# Patient Record
Sex: Female | Born: 1985 | Race: White | Hispanic: No | Marital: Married | Smoking: Former smoker
Health system: Southern US, Community
[De-identification: ages and names within clinical notes are randomized; demographics above are authoritative.]

---
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
## 2005-01-10 ENCOUNTER — Ambulatory Visit: Payer: Self-pay | Admitting: Family Medicine

## 2005-01-24 ENCOUNTER — Ambulatory Visit: Payer: Self-pay | Admitting: Family Medicine

## 2005-02-05 ENCOUNTER — Inpatient Hospital Stay (HOSPITAL_COMMUNITY): Admission: AD | Admit: 2005-02-05 | Discharge: 2005-02-05 | Payer: Self-pay | Admitting: *Deleted

## 2005-02-07 ENCOUNTER — Ambulatory Visit: Payer: Self-pay | Admitting: *Deleted

## 2005-02-14 ENCOUNTER — Ambulatory Visit: Payer: Self-pay | Admitting: Family Medicine

## 2005-02-28 ENCOUNTER — Ambulatory Visit: Payer: Self-pay | Admitting: Family Medicine

## 2005-03-07 ENCOUNTER — Ambulatory Visit: Payer: Self-pay | Admitting: Family Medicine

## 2005-03-14 ENCOUNTER — Ambulatory Visit: Payer: Self-pay | Admitting: *Deleted

## 2005-03-14 ENCOUNTER — Inpatient Hospital Stay (HOSPITAL_COMMUNITY): Admission: AD | Admit: 2005-03-14 | Discharge: 2005-03-17 | Payer: Self-pay | Admitting: Obstetrics & Gynecology

## 2006-05-17 IMAGING — US US OB COMP +14 WK
1 series · 13 of 28 positions shown · non-contrast
Comparison: none

CLINICAL DATA: Bleeding, cramping, assigned gestational age of 22 weeks, 6 days.

[Series 1: us ob comp +14 wk · 0.33mm/px · 13 of 118 slices shown]
[im 5/118]
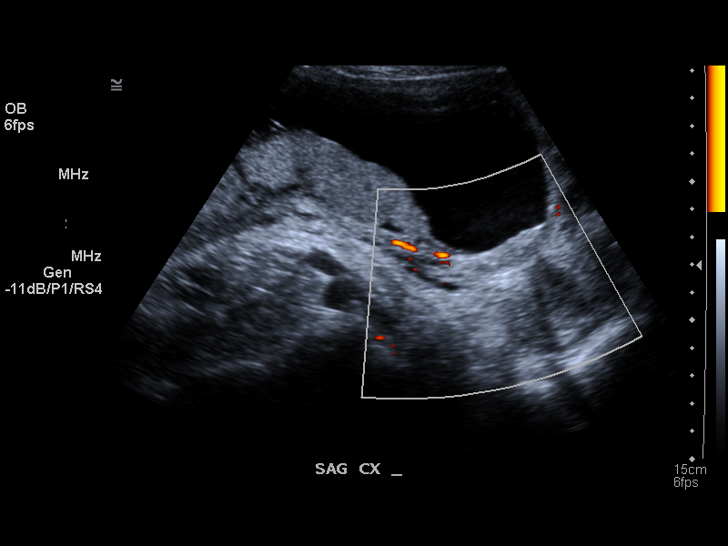
[im 14/118]
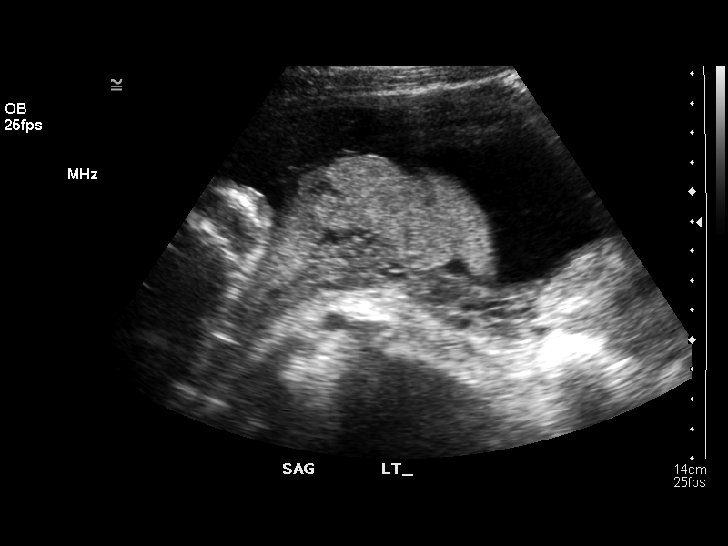
[im 22/118]
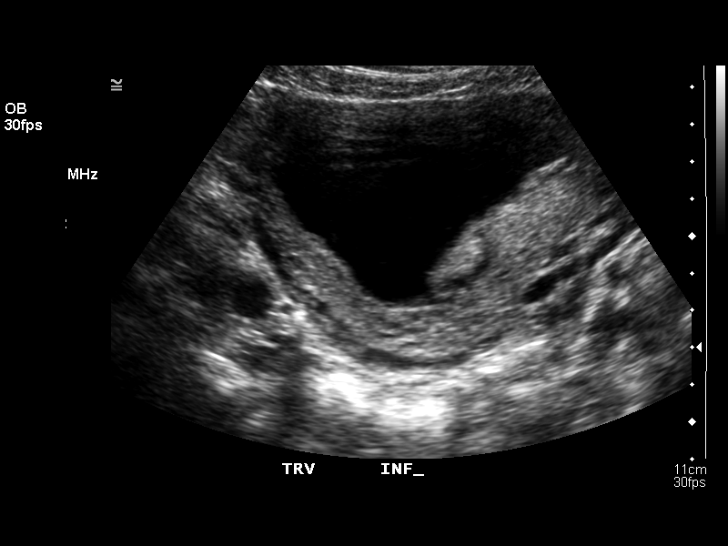
[im 31/118]
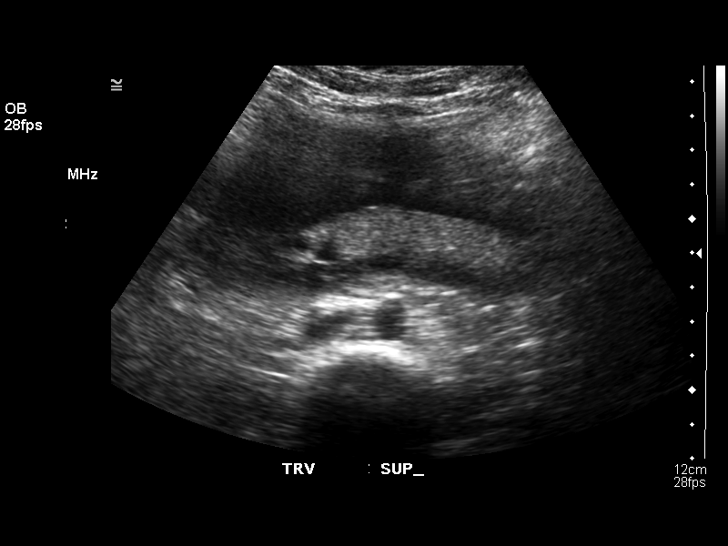
[im 40/118]
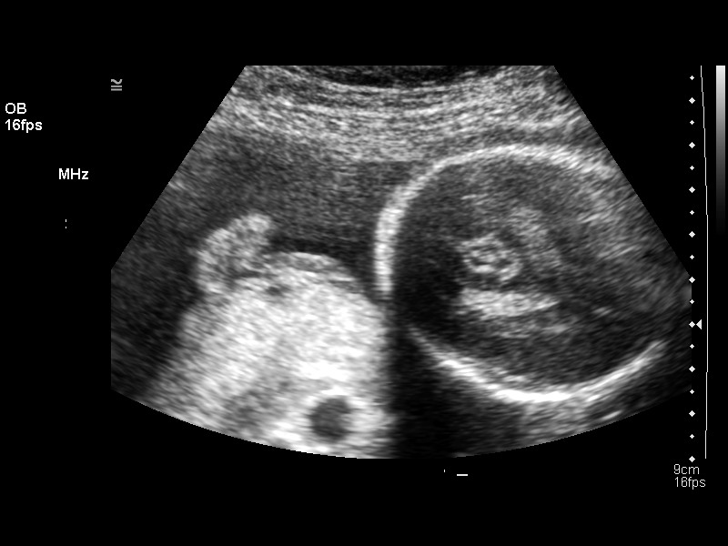
[im 48/118]
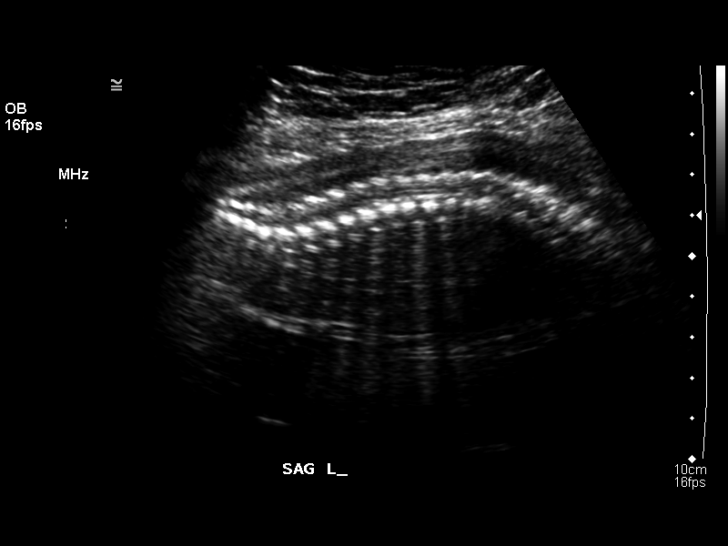
[im 61/118]
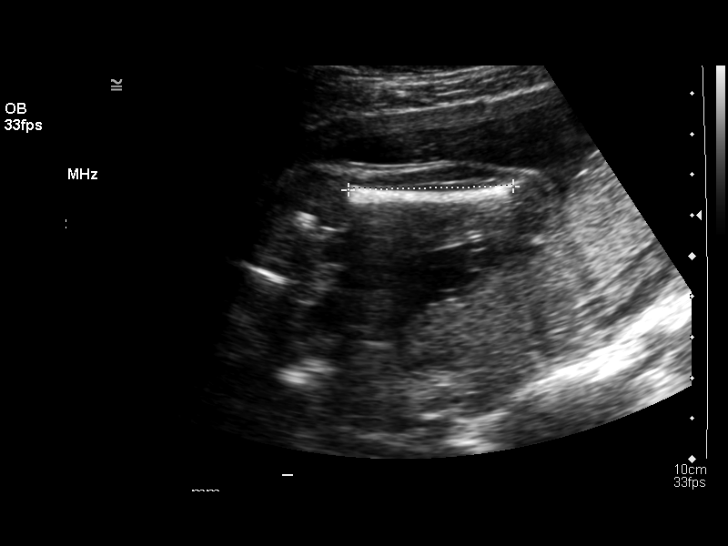
[im 70/118]
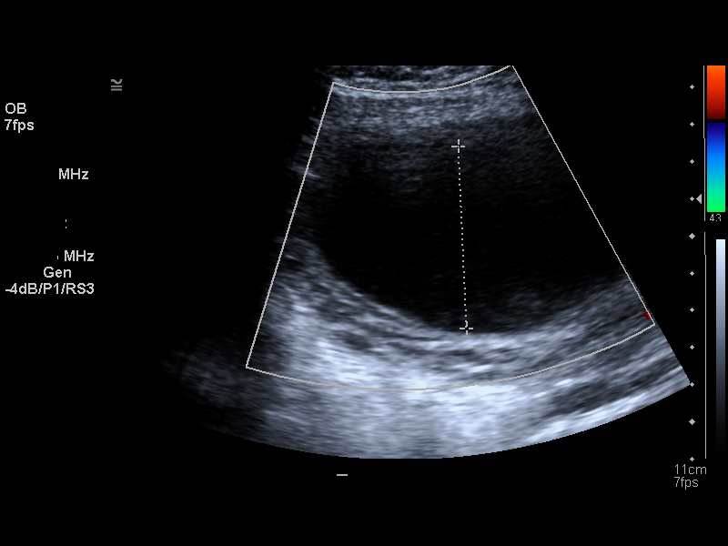
[im 79/118]
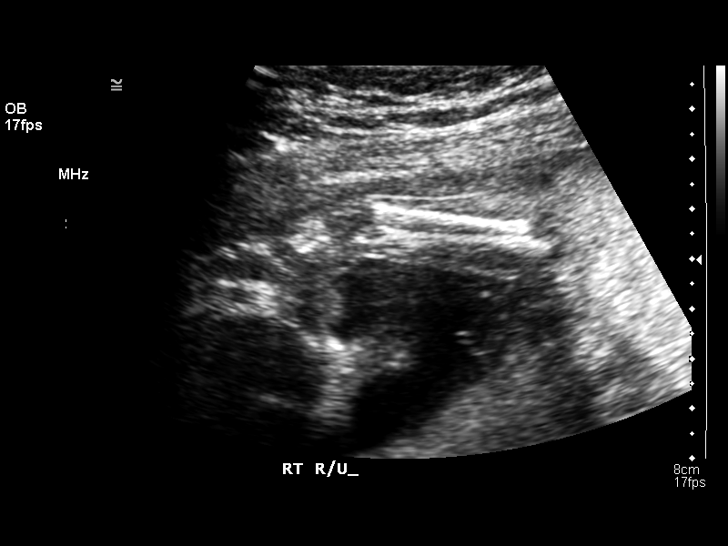
[im 87/118]
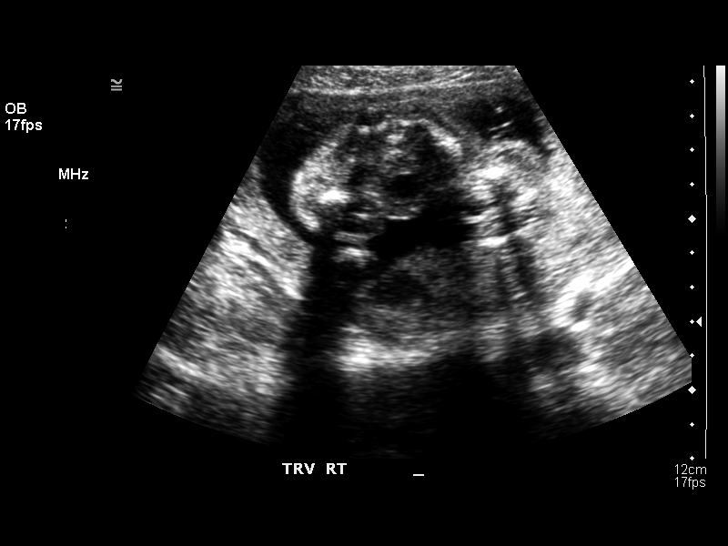
[im 96/118]
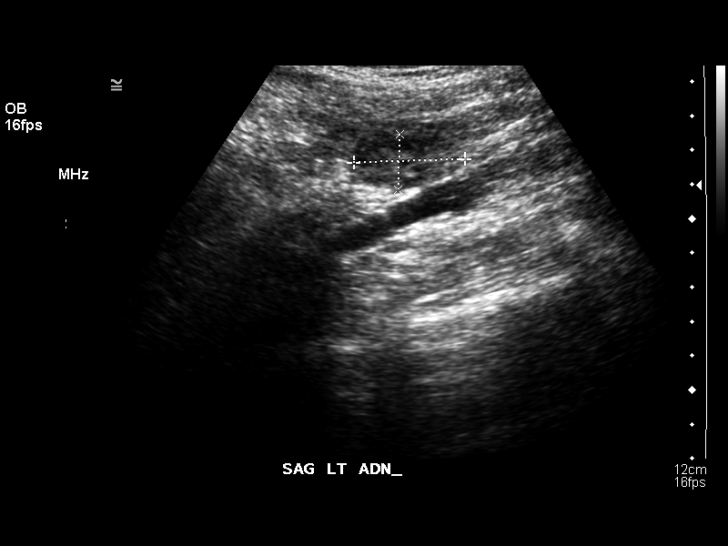
[im 105/118]
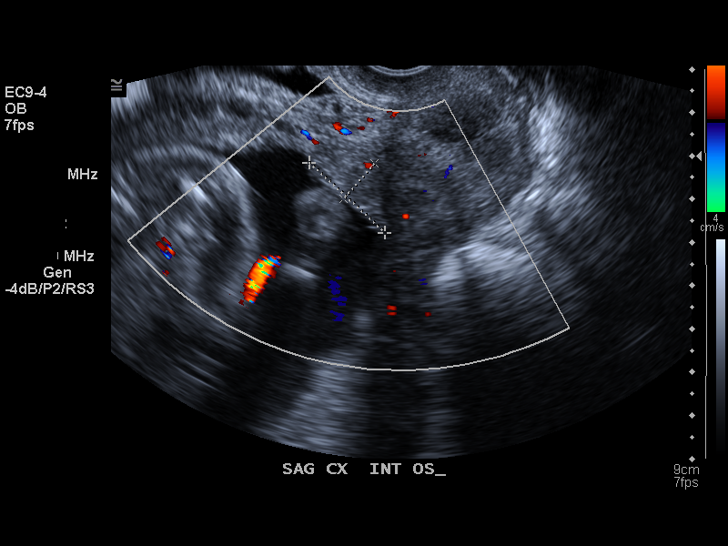
[im 113/118]
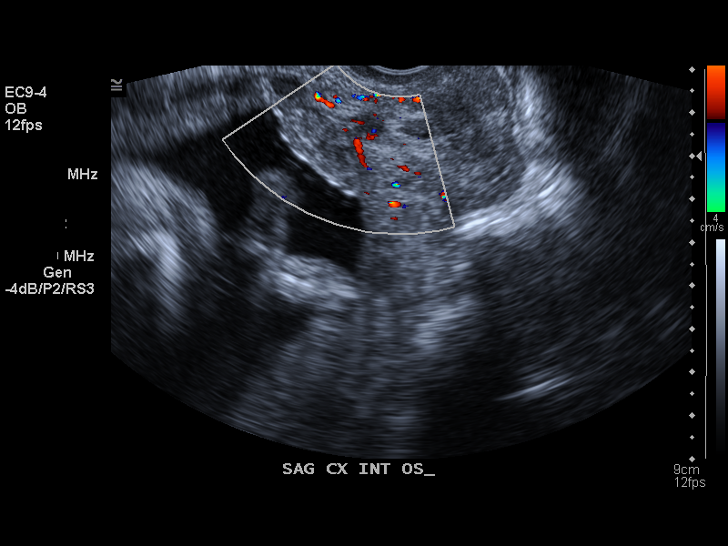

[13 of 28 positions shown; findings below may reference images not displayed]

OBSTETRICAL ULTRASOUND:
 Number of Fetuses:  Single
 Heart Rate:  147 bpm
 Movement:  Yes
 Breathing:  No
 Presentation:  Transverse
 Placental Location:  Posterior
 Grade:  0
 Previa:  No
 Amniotic Fluid (Subjective):  Normal
 Amniotic Fluid (Objective):   Vertical Pocket 4.9 cm

 FETAL BIOMETRY
 BPD:  5.4 cm  22 w  3 d
 HC:  20.5 cm  22 w  4 d
 AC:  19.6 cm  24 w  2 d
 FL:  4.0 cm  22 w   5 d

 MEAN GA:  23 w  0 d    
 EFW:  

 FETAL ANATOMY
 Lateral Ventricles:  Visualized 
 Thalami/CSP:  Visualized   
 Posterior Fossa:  Visualized   
 Nuchal Region:  Not Visualized 
 Spine:  Visualized   
 4 Chamber Heart on Left:  Visualized   
 Stomach on Left:  Visualized   
 3 Vessel Cord:  Visualized 
 Cord Insertion site:    Visualized 
 Kidneys:  Visualized 
 Bladder:  Visualized   
 Extremities:  Visualized   

 ADDITIONAL ANATOMY VISUALIZED:  Upper lip, Orbits, Diaphragm, Heel, Aortic Arch

 Evaluation limited by:

 MATERNAL UTERINE AND ADNEXAL FINDINGS
 Cervix: 4.0 cm
IMPRESSION: Single living intrauterine gestation currently in transverse lie with the head to the maternal left.   There is a questionable marginal abruption associated with the right lateral inferior placenta measuring 3.4 x .7 x 2.3 cm in size and there is a small echogenic area visualized at the internal os of the cervix measuring 2.4 x 1.1 x 2.8 cm in size which appears separate from the placenta.   This is of uncertain etiology.  The amniotic fluid volume is normal.   

 </u12:p>

## 2006-06-08 IMAGING — US US OB FOLLOW-UP
1 series · 18 of 28 positions shown · non-contrast
Comparison: none

CLINICAL DATA: 26 weeks pregnant, placenta previa, bright red blood

[Series 1: us ob follow-up · 18 of 37 slices shown]
[im 1/37]
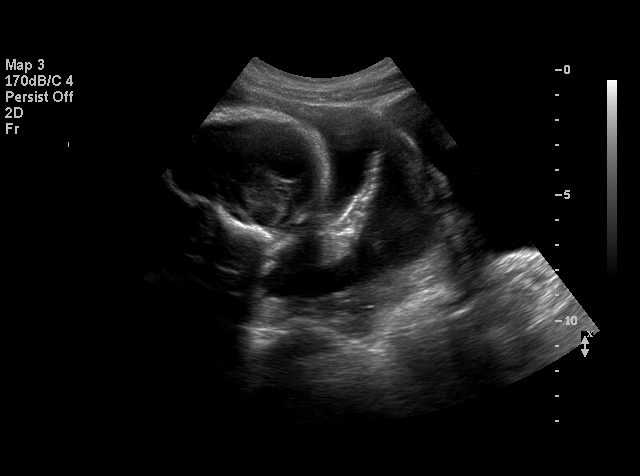
[im 3/37]
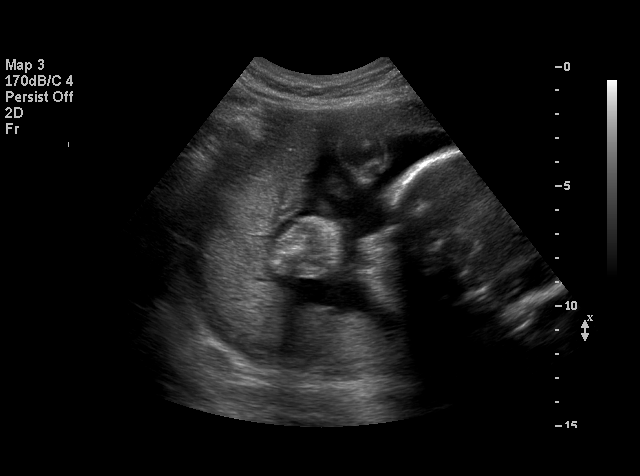
[im 5/37]
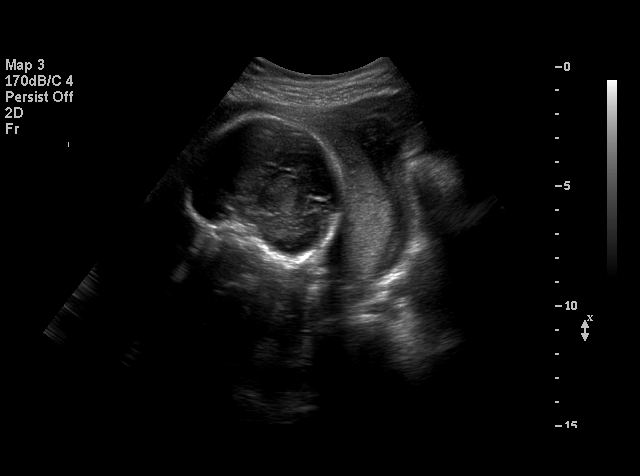
[im 7/37]
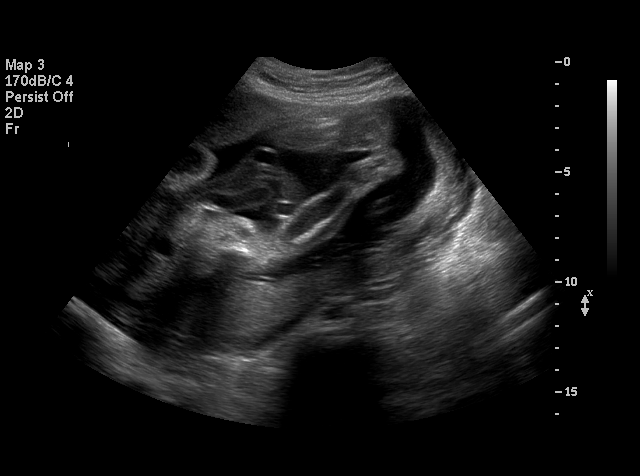
[im 10/37]
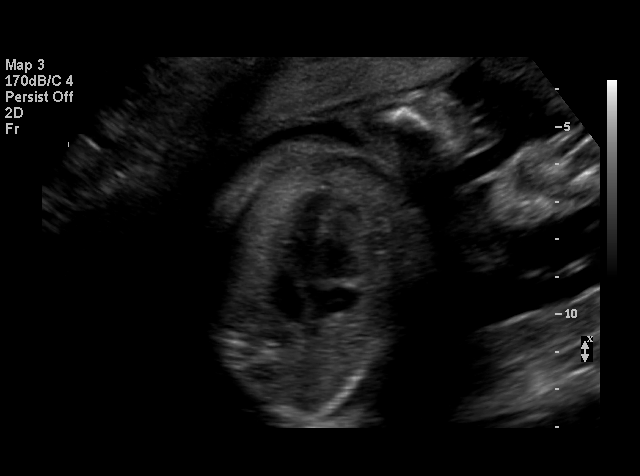
[im 11/37]
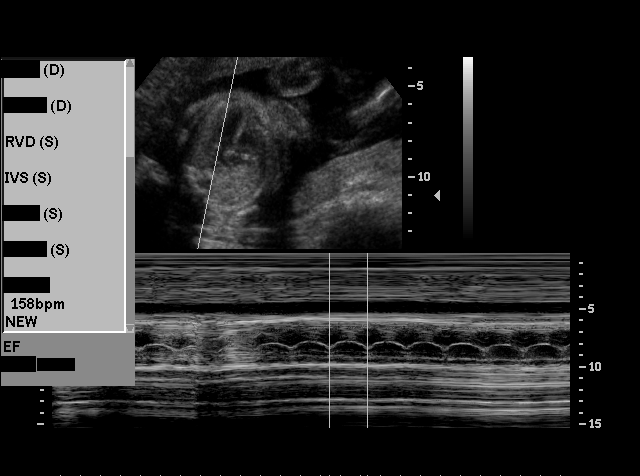
[im 14/37]
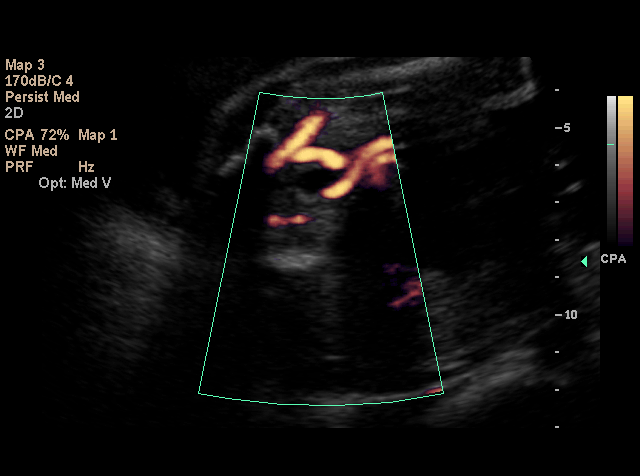
[im 15/37]
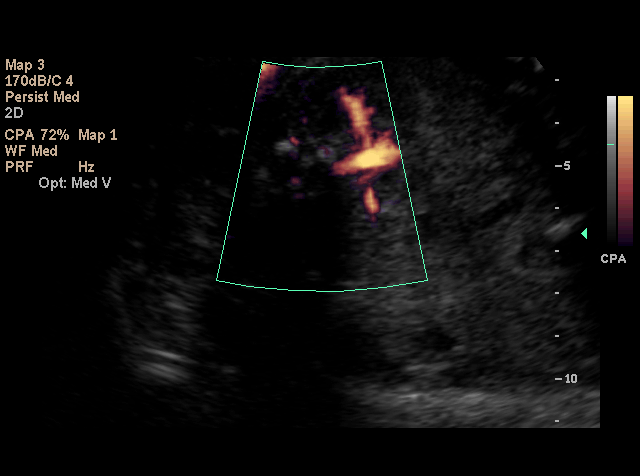
[im 18/37]
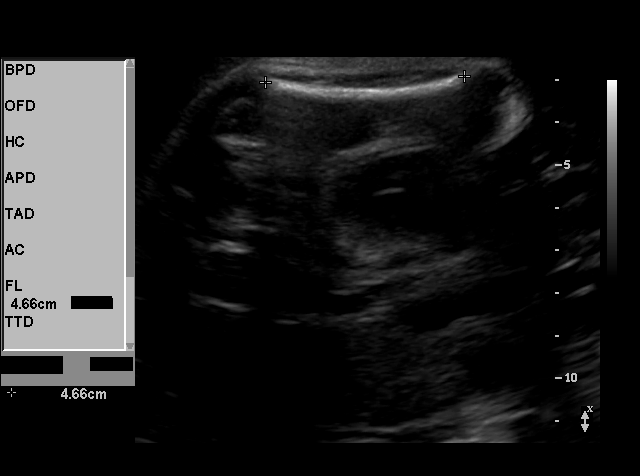
[im 19/37]
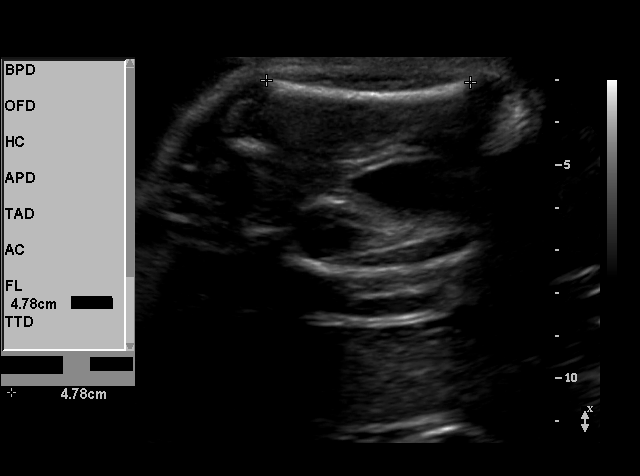
[im 22/37]
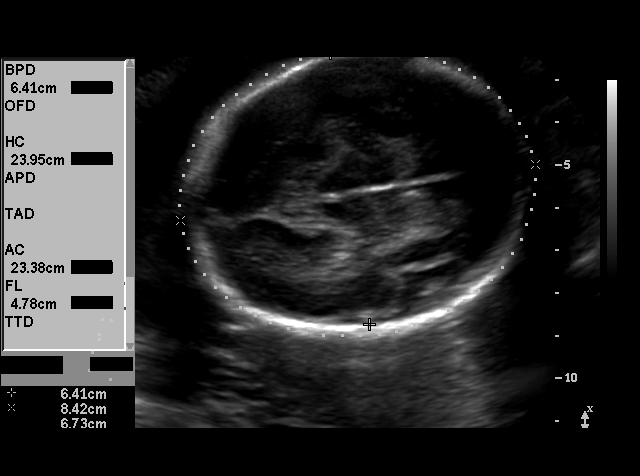
[im 23/37]
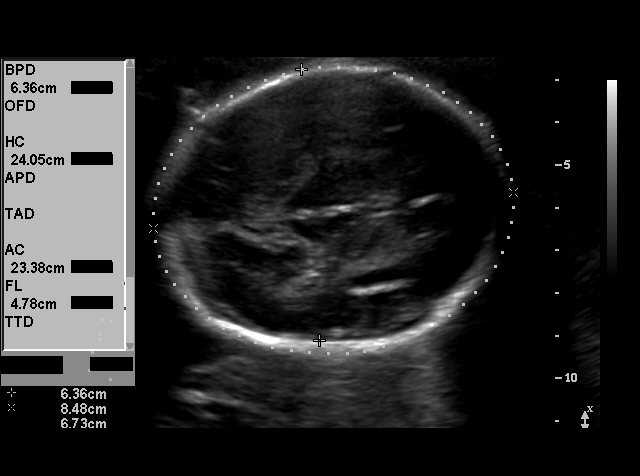
[im 26/37]
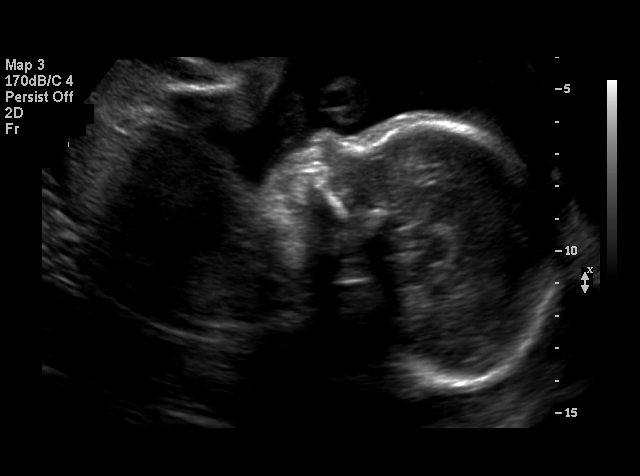
[im 29/37]
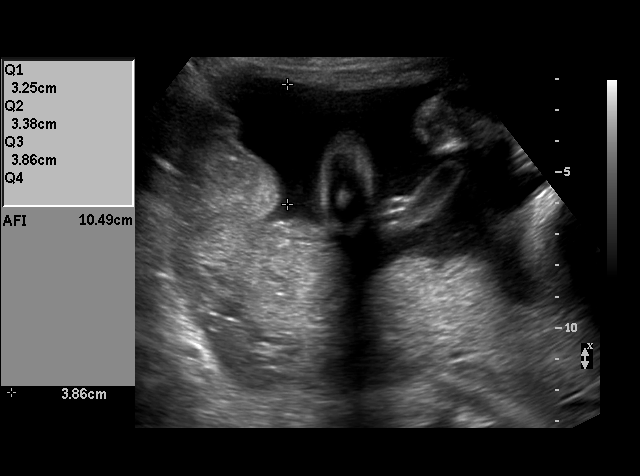
[im 30/37]
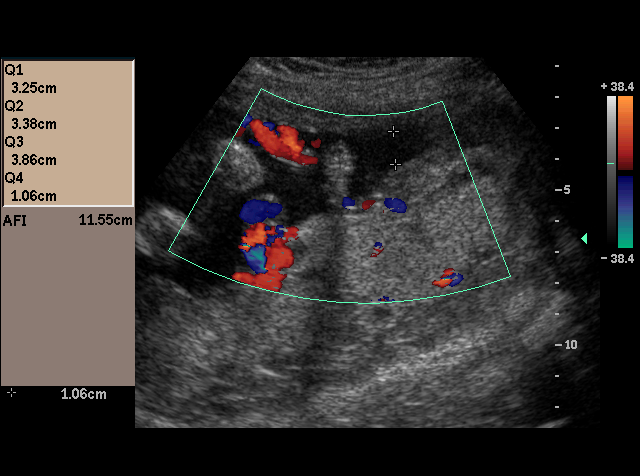
[im 33/37]
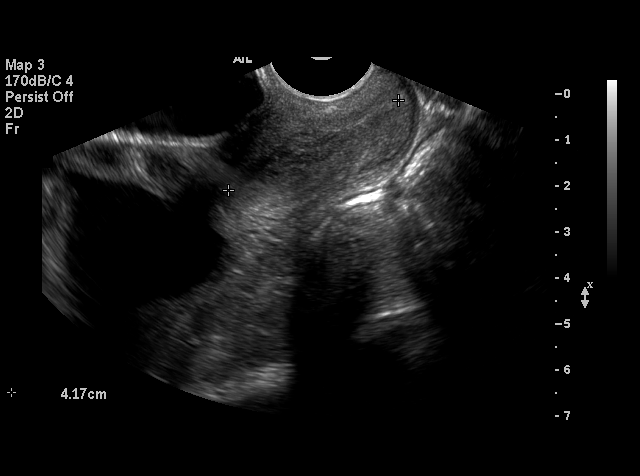
[im 34/37]
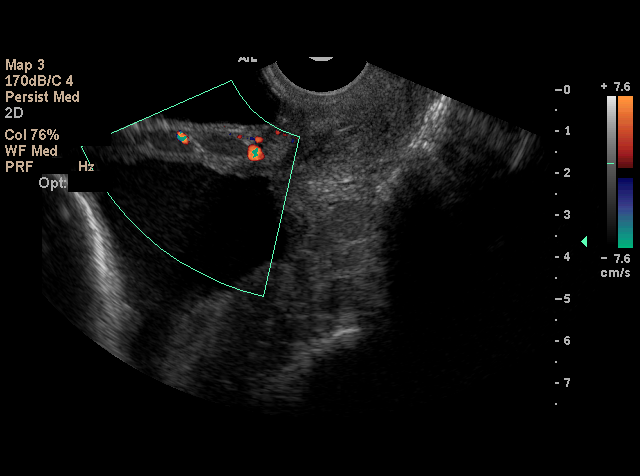
[im 37/37]
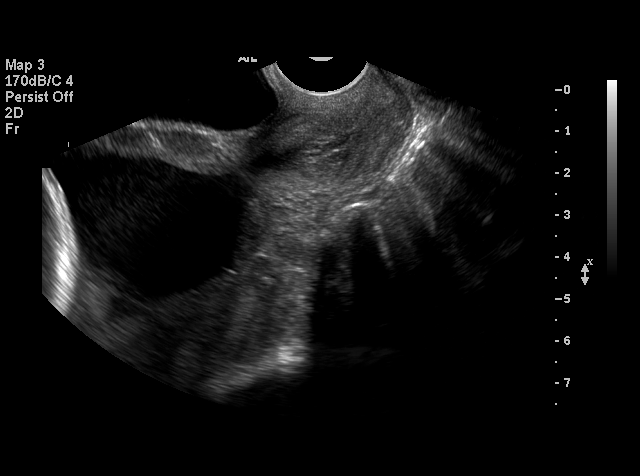

[18 of 28 positions shown; findings below may reference images not displayed]

OBSTETRICAL ULTRASOUND RE-EVALUATION WITH TRANSVAGINAL:
 Number of Fetuses: 1
 Heart Rate: 158 bpm
 Movement: yes
 Breathing:  no
 Presentation:  cephalic
 Placental Location:  posterior, left lateral
 Grade: 1
 Previa: no
 Amniotic Fluid (subjective): normal
 Amniotic Fluid (objective): AFI 11.6 cm (7th-27th %ile = 9.7 to 22.3 cm for 26 weeks) 

 FETAL BIOMETRY
 BPD: 6.4 cm – 25 w 6 d
 HC: 24.0 cm – 26 w 1 d
 AC: 22.8 cm – 27 w 2 d
 FL: 4.7 cm – 25 w 6 d 

 Mean GA:  26 w 2 d
 Assigned GA: 26 w 0 d

 EFW: 944 grams (H) 97th-34th %ile (885 to 9497 g) for 26 weeks 

 FETAL ANATOMY
 Lateral Ventricles:  visualized 
 Thalami/CSP: previously seen 
 Posterior Fossa: previously seen 
 Nuchal Region: previously seen 
 Spine: previously seen 
 4 Chamber Heart on Left: previously seen 
 Stomach on Left: visualized 
 3 Vessel Cord: visualized 
 Cord Insertion Site: visualized 
 Kidneys:  visualized 
 Bladder: visualized 
 Extremities: previously seen 

 MATERNAL UTERINE AND ADNEXAL FINDINGS
 Cervix:  4.2 cm transvaginally
IMPRESSION: 1.  Single live intrauterine pregnancy in cephalic presentation.  Placenta is posterior and right lateral with the inferior extent poorly visualized due to the fetal calvarium.  However, the inferior edge is seen, low lying but approximately 2.5 cm from the cervical os.  Cervix appears closed, 4.2 cm length.  The small abruption questioned on the previous exam is not seen on the current study, though visualization of the inferior placenta is somewhat limited.
 2.  Appropriate growth since prior ultrasound.
 3.  Normal amniotic fluid volume with AFI 11.6 cm, normal.

## 2006-06-19 IMAGING — US US OB FOLLOW-UP
1 series · 13 of 28 positions shown · non-contrast
Comparison: none

CLINICAL DATA: Assess growth.

[Series 1: us ob follow-up · 13 of 35 slices shown]
[im 2/35]
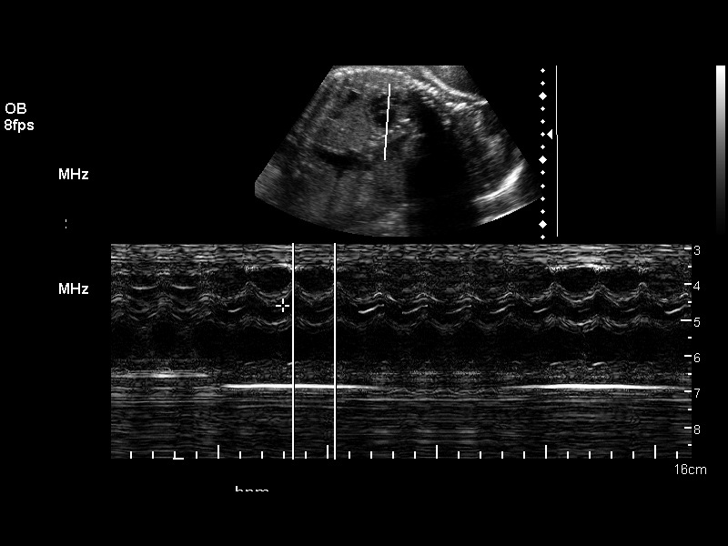
[im 4/35]
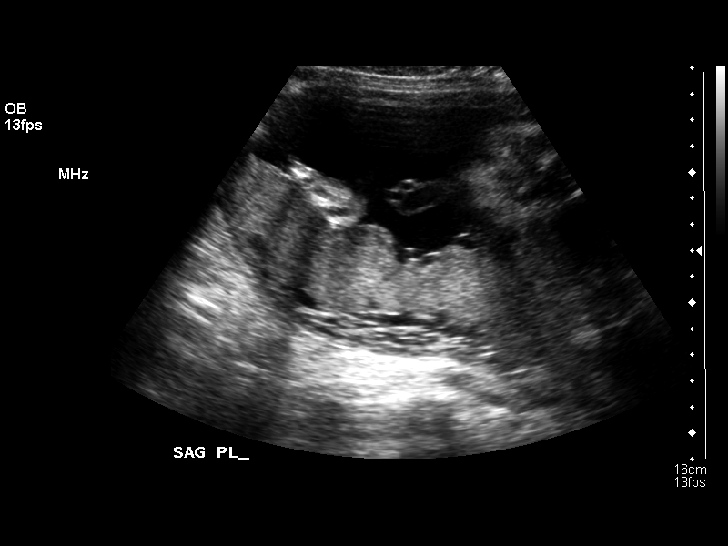
[im 7/35]
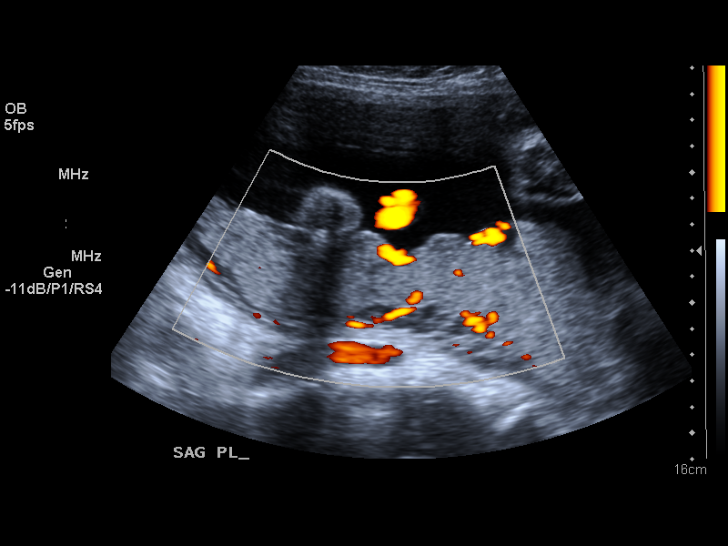
[im 9/35]
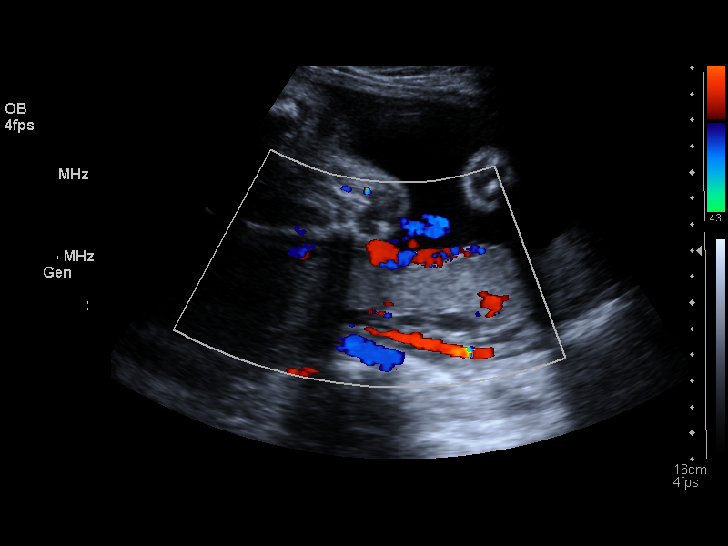
[im 12/35]
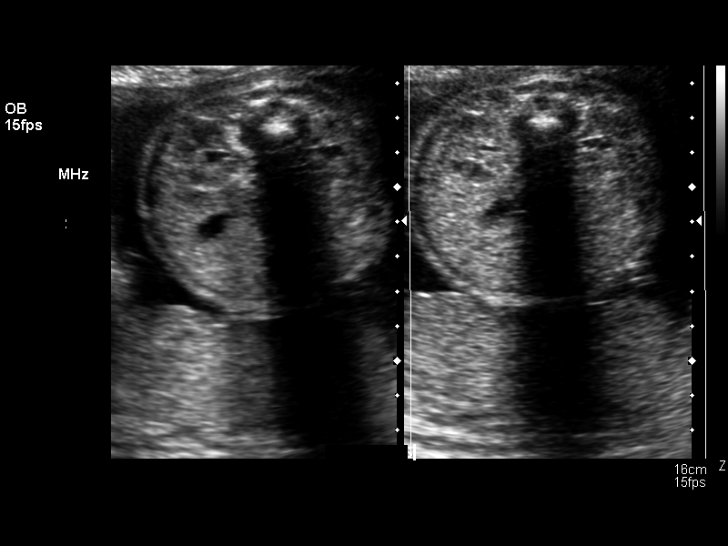
[im 14/35]
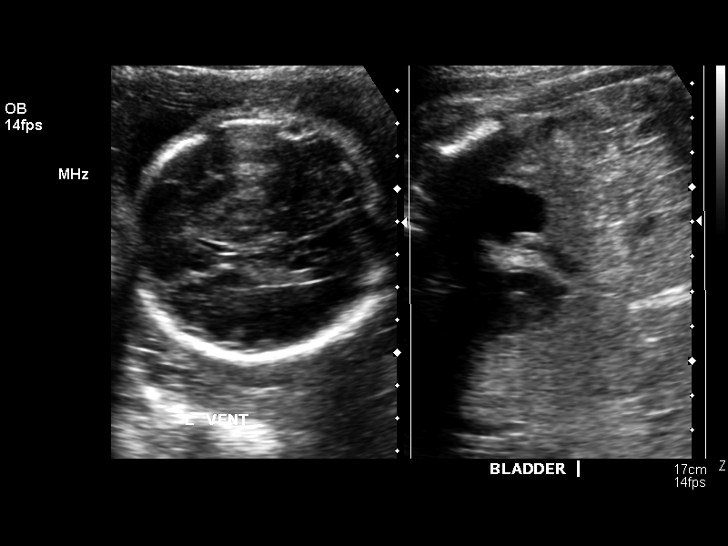
[im 18/35]
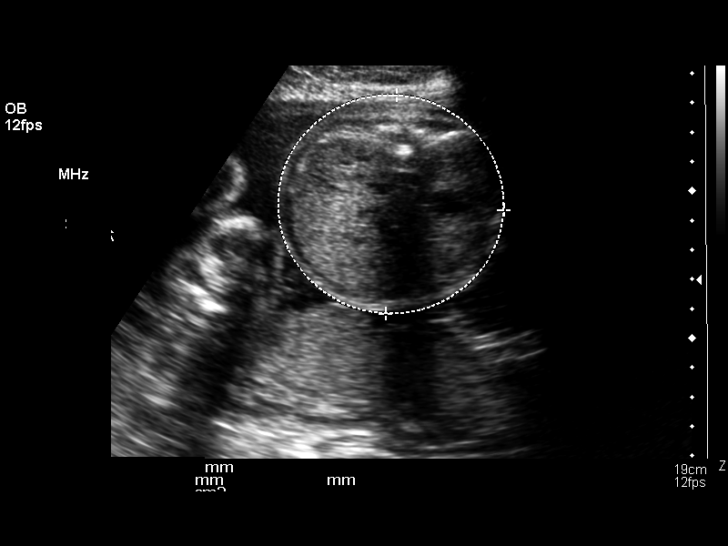
[im 21/35]
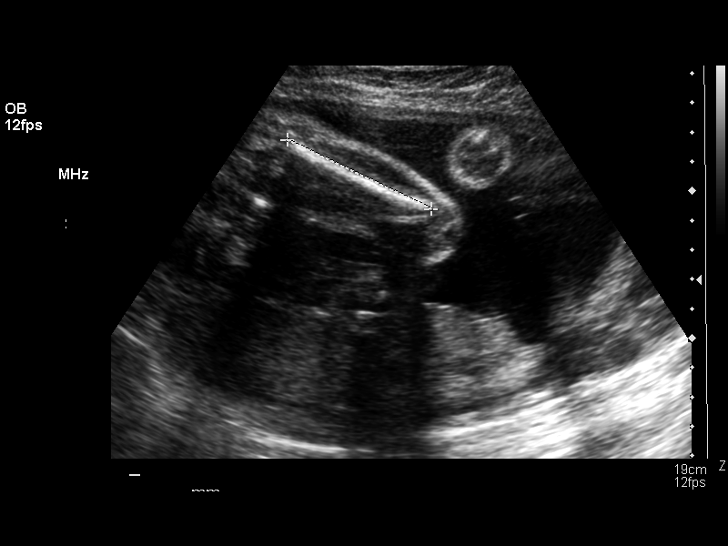
[im 23/35]
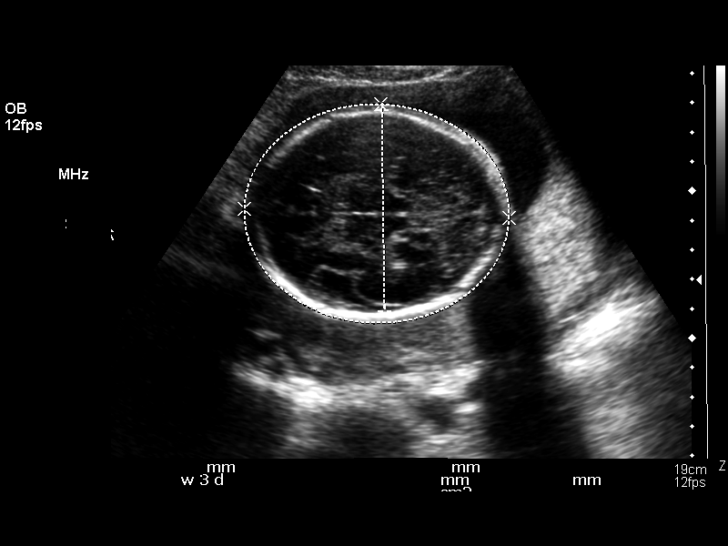
[im 26/35]
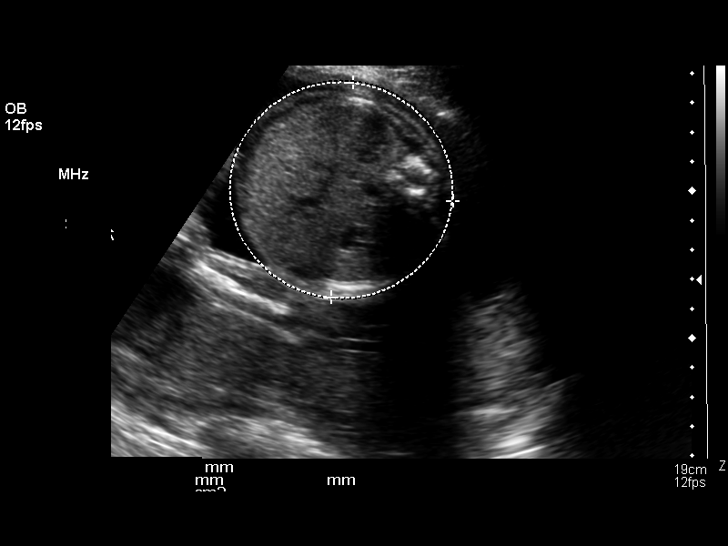
[im 28/35]
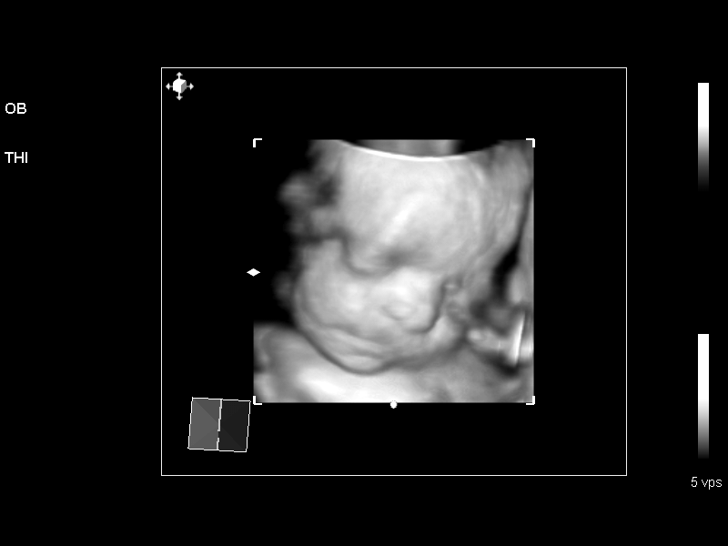
[im 31/35]
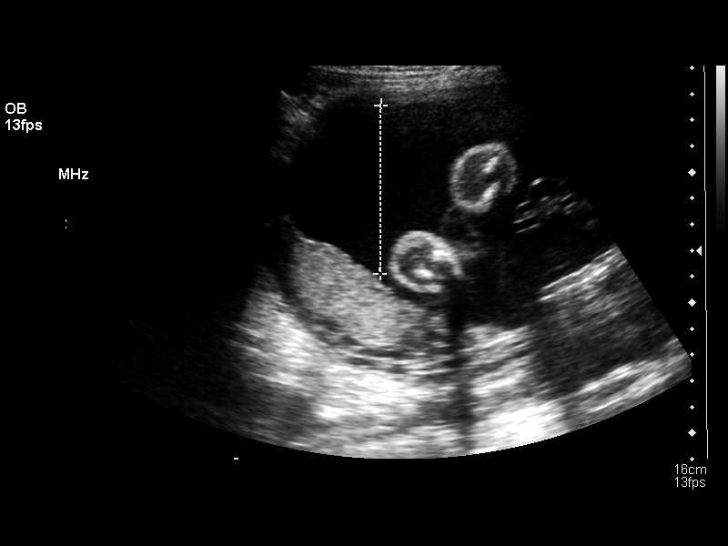
[im 33/35]
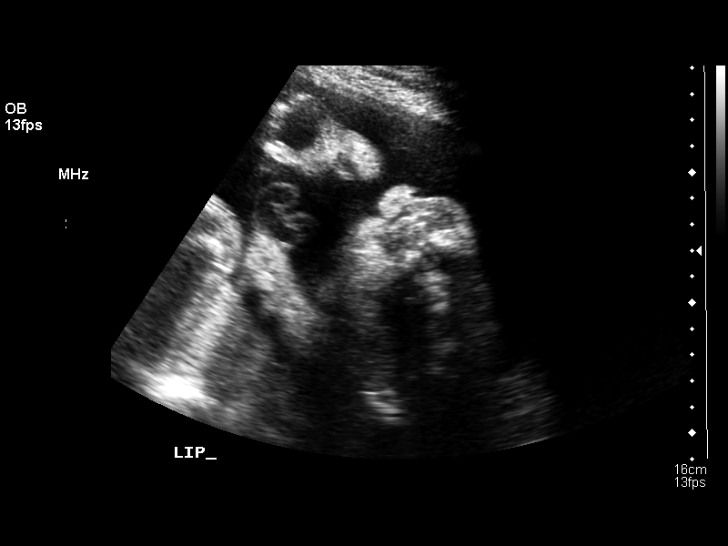

[13 of 28 positions shown; findings below may reference images not displayed]

OBSTETRICAL ULTRASOUND RE-EVALUATION:
Number of Fetuses: 1
Heart Rate:  157
Movement:  Yes
Breathing:  No
Presentation:  Cephalic
Placental Location:  Posterior
Grade:  I
Previa:  No
Amniotic Fluid (subjective):  Normal
Amniotic Fluid (objective):  6.5 cm Vertical pocket 

FETAL BIOMETRY
BPD:  6.9 cm   27 w 5 d
HC:  26.2 cm  28 w 4 d
AC:  23.7 cm   28 w 0 d
FL:  5.3 cm   28 w 2 d

Mean GA:  28 w 1 d
Assigned GA:  27 w 4 d (1st US)
Fetal indices are within normal limits.
EFW:  1139 g (H) 75th ? 90th%ile (1168 ? 5244 g) For 28 wks

FETAL ANATOMY
Lateral Ventricles:  Visualized 
Thalami/CSP:  Previously seen 
Posterior Fossa:  Previously seen 
Nuchal Region:  N/A
Spine:  Previously seen 
4 Chamber Heart on Left:  Previously seen 
Stomach on Left:  Visualized 
3 Vessel Cord:  Previously seen 
Cord Insertion Site:  Previously seen 
Kidneys:  Visualized 
Bladder:  Visualized 
Extremities:  Previously seen 

MATERNAL UTERINE AND ADNEXAL FINDINGS
Cervix:  3.6 cm Transabdominally
IMPRESSION: 1.  Single intrauterine pregnancy demonstrating an estimated gestational age by ultrasound of 28 weeks and 1 day.  Correlation with assigned gestational age of 27 weeks and 4 days suggests appropriate growth.  Currently the estimated fetal weight is between the 75th and 90th percentile and this remains stable in comparison with the previous exam performed at 26 weeks suggesting appropriate linear growth.
2.  Subjectively and quantitatively normal amniotic fluid volume and normal cervical length.
3.  No late developing fetal anatomic abnormalities are identified associated with the   lateral ventricles, stomach, kidneys or bladder. A four chamber heart view could not be reassessed due to positioning on today?s exam.

## 2006-09-18 IMAGING — US US OB LIMITED
1 series · 14 of 20 positions shown · non-contrast
Comparison: none

CLINICAL DATA: Lower abdominal pain; oligohydramnios; assigned gestational age is 40 weeks 4 days.

[Series 1: us ob limited · 0.37mm/px · 14 of 20 slices shown]
[im 1/20]
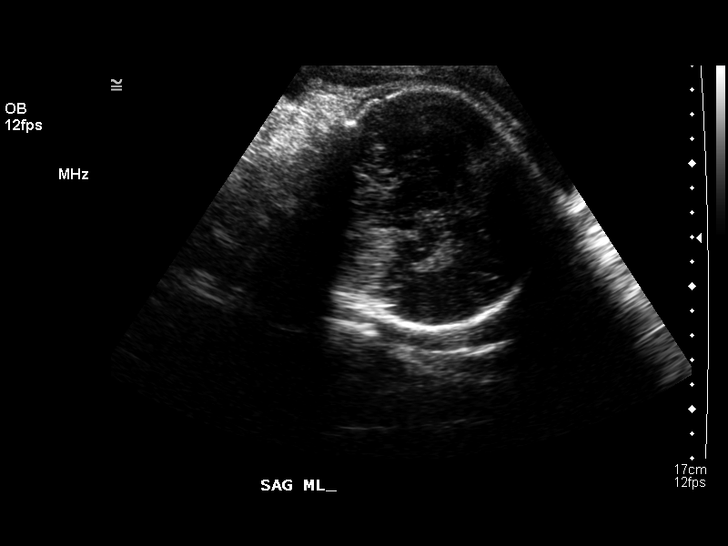
[im 3/20]
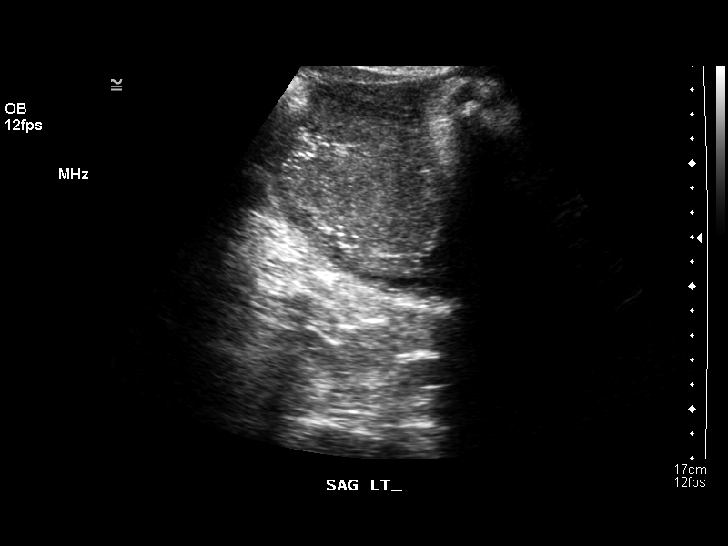
[im 4/20]
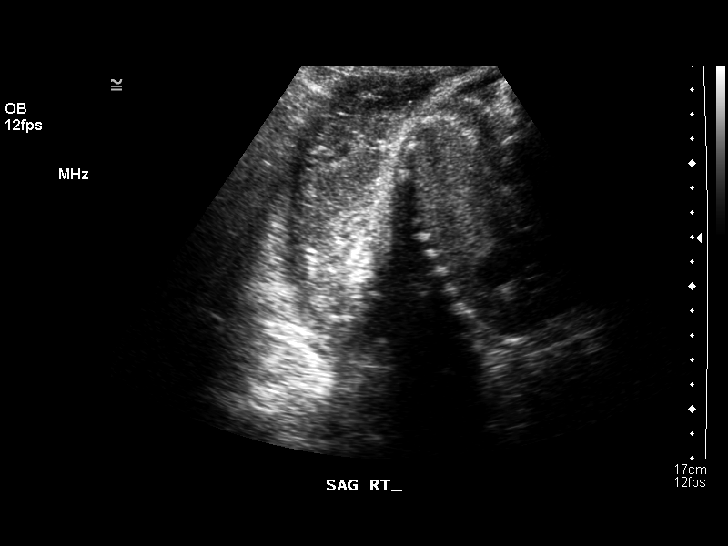
[im 6/20]
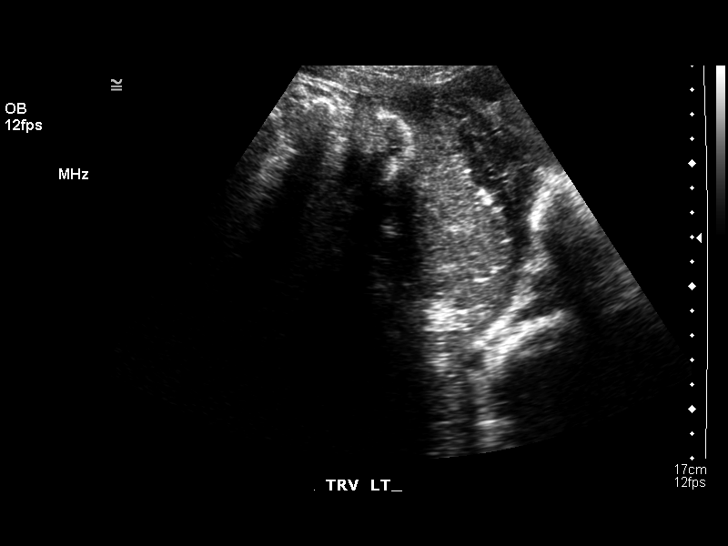
[im 7/20]
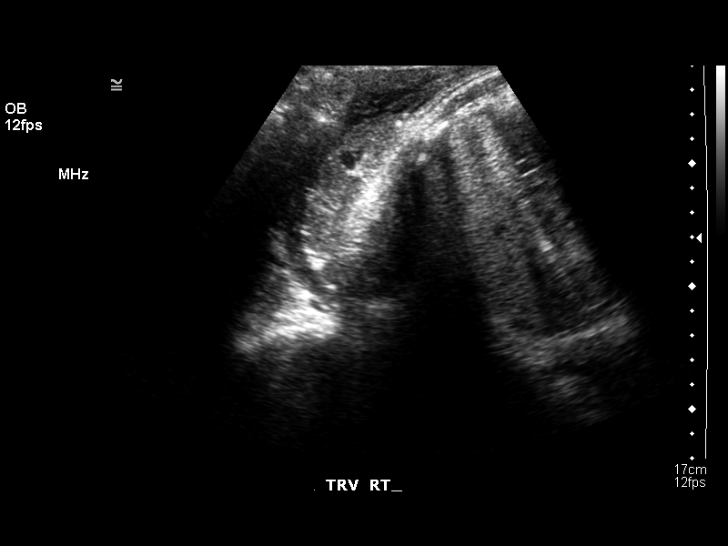
[im 8/20]
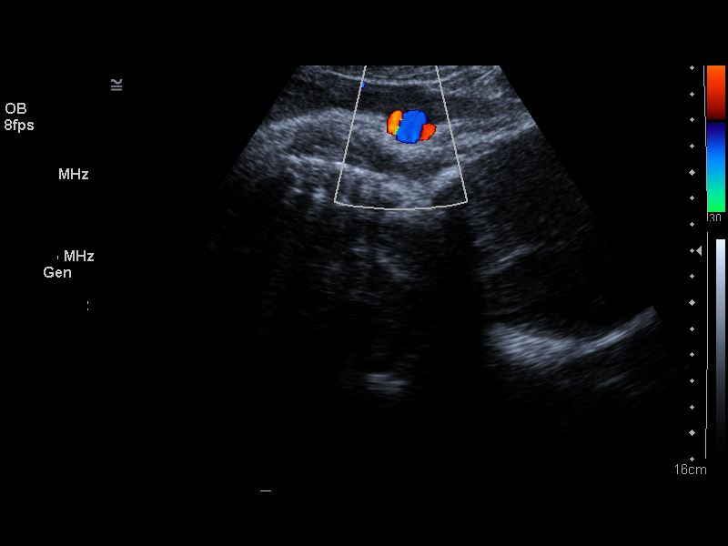
[im 10/20]
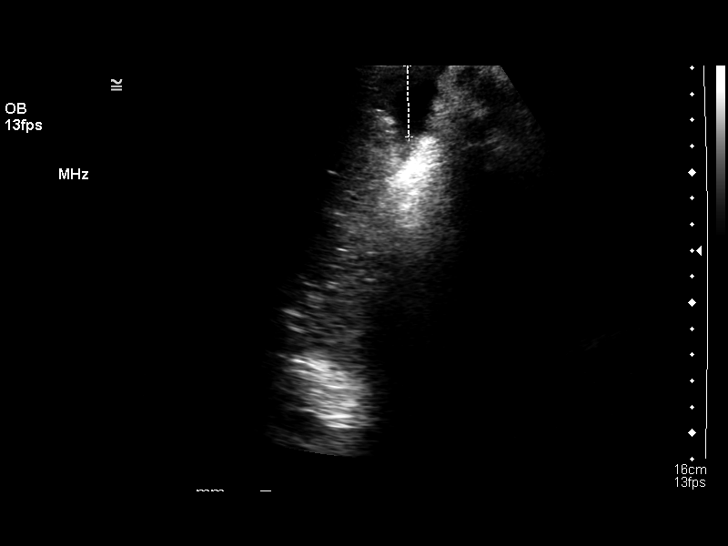
[im 11/20]
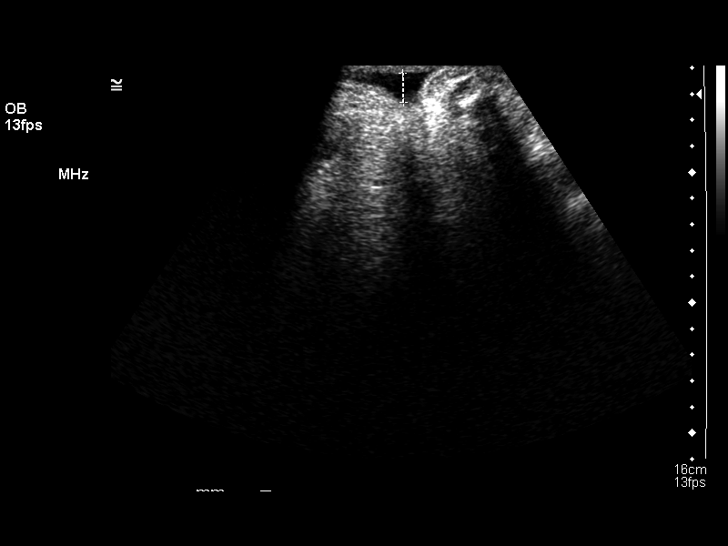
[im 13/20]
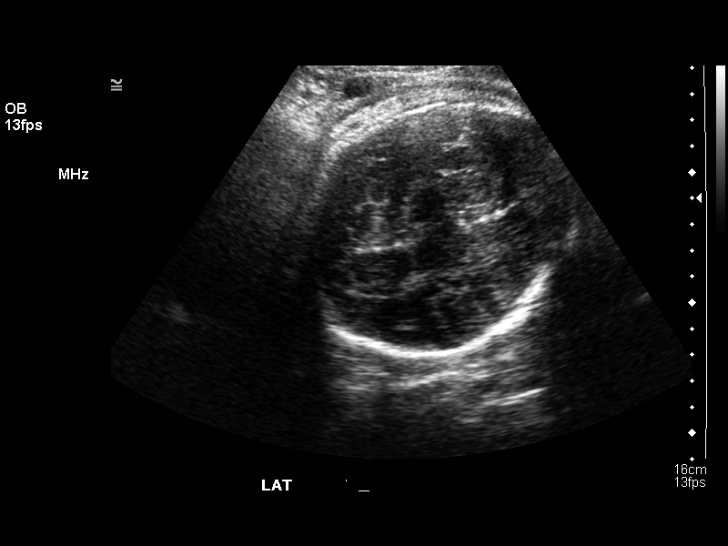
[im 14/20]
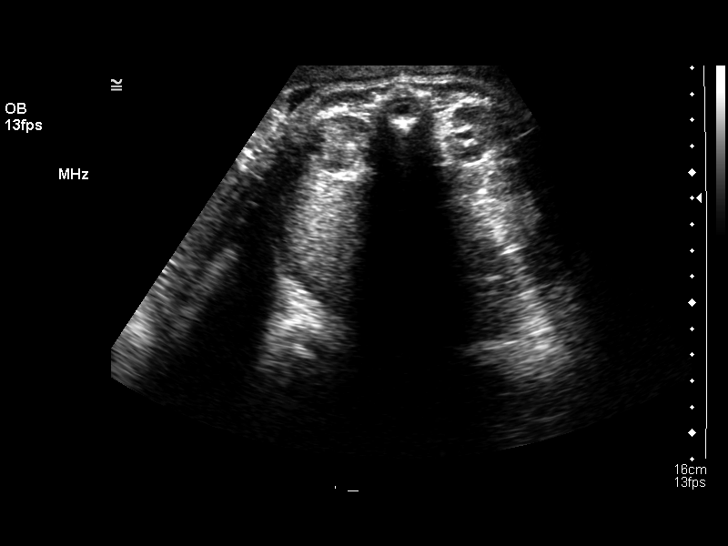
[im 16/20]
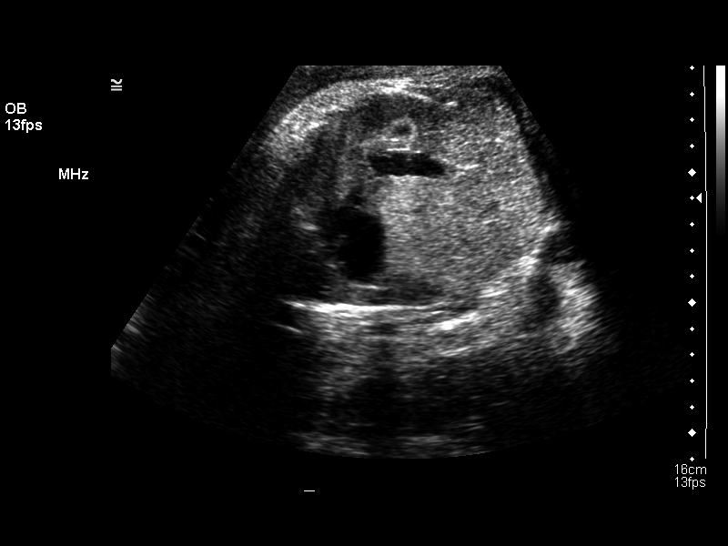
[im 17/20]
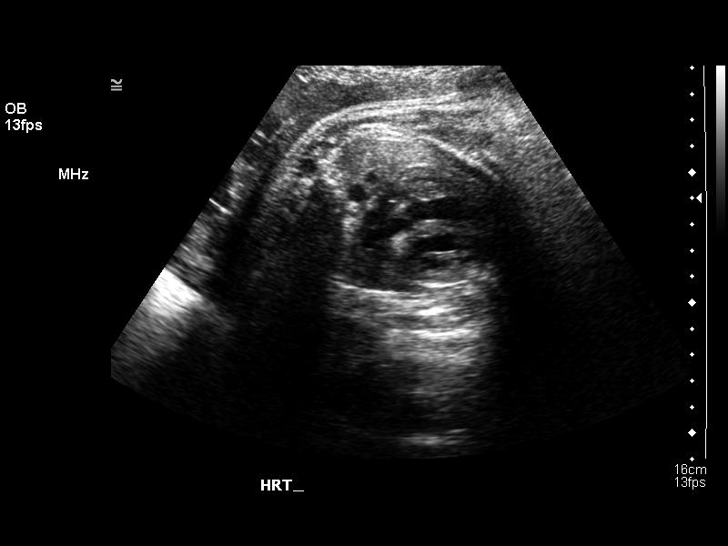
[im 18/20]
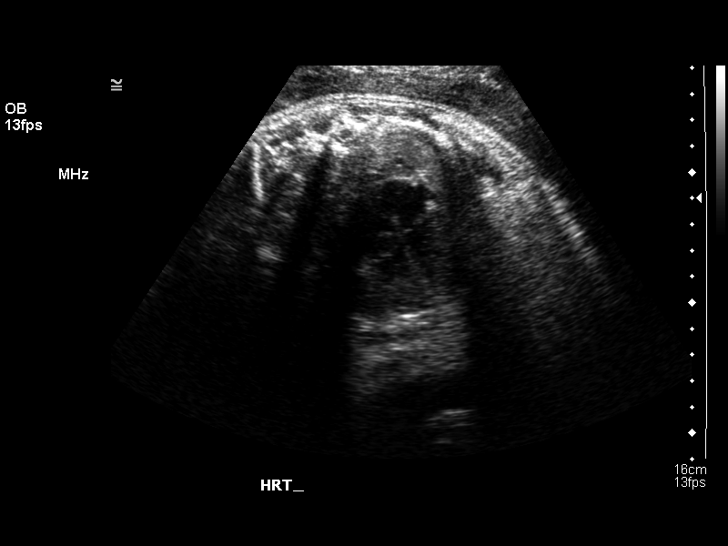
[im 20/20]
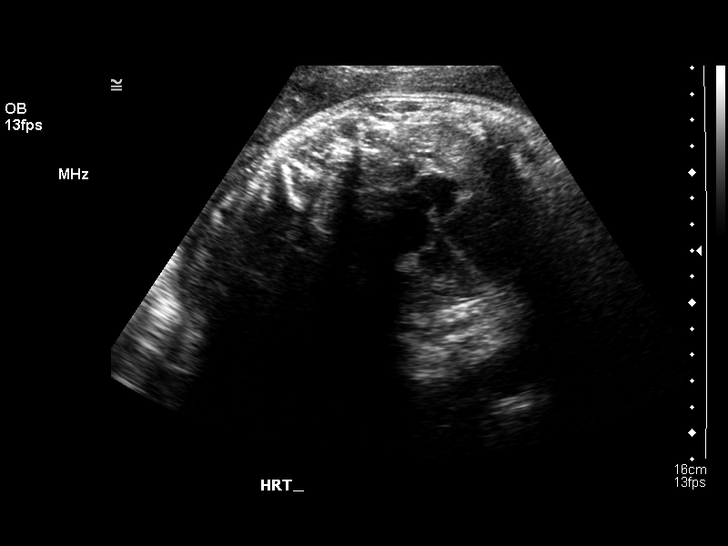

[14 of 20 positions shown; findings below may reference images not displayed]

LIMITED OBSTETRICAL ULTRASOUND:
 Number of Fetuses:  1
 Heart Rate:  136
 Movement:  Yes
 Breathing:  No
 Presentation:  Cephalic
 Placental Location:  Fundal, posterior
 Grade:  II
 Previa:  No
 Amniotic Fluid (Subjective):  Decreased
 Amniotic Fluid (Objective):  5.6 cm AFI (5th -95th%ile = 7.0 ? 19.4 cm for 41 wks)

 Fetal measurements and complete anatomic evaluation were not requested.  The following fetal anatomy was visualized during this exam:  Lateral ventricles, four chamber heart, stomach, kidneys and bladder. 

 MATERNAL UTERINE AND ADNEXAL FINDINGS
 Cervix:  Not evaluated
IMPRESSION: There is a single living intrauterine gestation in cephalic presentation.  The amniotic fluid volume is decreased with an AFI of 5.6 cm (normal for 41 weeks is 7.0 ? 19.4).

## 2006-10-01 ENCOUNTER — Emergency Department (HOSPITAL_COMMUNITY): Admission: EM | Admit: 2006-10-01 | Discharge: 2006-10-01 | Payer: Self-pay | Admitting: Emergency Medicine

## 2007-02-16 ENCOUNTER — Emergency Department (HOSPITAL_COMMUNITY): Admission: EM | Admit: 2007-02-16 | Discharge: 2007-02-16 | Payer: Self-pay | Admitting: Emergency Medicine

## 2009-09-27 ENCOUNTER — Emergency Department (HOSPITAL_BASED_OUTPATIENT_CLINIC_OR_DEPARTMENT_OTHER): Admission: EM | Admit: 2009-09-27 | Discharge: 2009-09-27 | Payer: Self-pay | Admitting: Emergency Medicine

## 2011-01-11 NOTE — Discharge Summary (Signed)
NAMEGUADALUPE, Stacie Moreno             ACCOUNT NO.:  [id number]   MEDICAL RECORD NO.:  [id number]          PATIENT TYPE:  INP   LOCATION:  9162                          FACILITY:  [facility]   PHYSICIAN:  Lesly Dukes, M.D. DATE OF BIRTH:  1985-09-09   DATE OF ADMISSION:  12/02/2004  DATE OF DISCHARGE:  12/03/2004                                 DISCHARGE SUMMARY   REASON FOR ADMISSION:  Marginal placenta with vaginal bleeding in a 25-6/7  weeks intrauterine pregnancy.   DISCHARGE DIAGNOSIS:  Marginal placenta with vaginal bleeding in a 25-6/7  weeks intrauterine pregnancy.  With resolved vaginal bleeding.   DISCHARGE INSTRUCTIONS:  [facility] appointment with [facility].  Continue taking prenatal vitamins.  Preterm labor precautions.   DISCHARGE MEDICATIONS:  Prenatal vitamins.   HOSPITAL COURSE:  This is an 25 year old gravida 1, who presented to the [facility]  at 25-6/7 weeks with a chief complaint of spotting.  She had been previously  diagnosed with placenta previa in March for which she was hospitalized for  approximately 2 weeks and now complains of bright red blood per vagina when  wiping.  On examination, she was found to have a cervical examination long,  closed, and high with some brown mucousy discharge.  Fetal heart rate  tracing was reassuring with baseline rate of 145 to 150, reactive, moderate  variability, and no decelerations.  She was admitted for 23-hour observation  and by 12 hours later had no more bleeding, denied any contractions.  Digital cervical examination revealed a cervix that was closed, long, thick,  and high.  The patient was discharged home at that time with follow-up in  [facility].      TH/MEDQ  D:  12/03/2004  T:  12/03/2004  Job:  [id number]

## 2011-01-11 NOTE — Discharge Summary (Signed)
Stacie Moreno, Stacie Moreno             ACCOUNT NO.:  [id number]   MEDICAL RECORD NO.:  [id number]          PATIENT TYPE:  INP   LOCATION:  9159                          [facility]ILITY:  [facility]   PHYSICIAN:  Dwana Curd. Para March, M.D. DATE OF BIRTH:  04/15/1986   DATE OF ADMISSION:  11/10/2004  DATE OF DISCHARGE:  11/22/2004                                 DISCHARGE SUMMARY   ADMISSION DIAGNOSES:  25 year old gravida 1 at 22 weeks and 6 days with a 17-  week ultrasound with marginal placental separation.   DISCHARGE DIAGNOSES:  1.  24-week and 3-day intrauterine pregnancy by 17-week ultrasound.  2.  Stable marginal abruption, status post betamethasone.   DISCHARGE MEDICATIONS:  1.  Prenatal vitamins one tablet p.o. daily.  2.  Colace 100 mg one tablet p.o. daily.  3.  Ferrous sulfate 325 mg one tablet p.o. b.i.d.   HISTORY OF PRESENT ILLNESS:  The patient is an 25 year old gravida 1, that  presented at 22 weeks and 6 days by 17-week ultrasound with bleeding and  cramping.  On ultrasound, the patient was found to have a marginal right  inferolateral placental separation.  Initial speculum examination showed the  os to be closed, fetal heart rate was in the 160's on intake.  The patient's  blood pressure was 126/67.  The patient was admitted for observation and  management.   HOSPITAL COURSE:  Problem 1.  Placental separation.  The patient had no  known risk factors with placental separation.  She had negative drug screen.  She was nonsmoker.  She was a gravida 1, she had no previous history of  separation.  She did have a family history of fetal loss at term in her  grandmother and miscarriages with her mother, so antiphospholipid antibody  workup was completed.  All of it was negative with the exception of factor V  Leiden which is pending at the time of discharge.   In terms of management of the patient, she was brought into the hospital,  typed and crossed.  Her hemoglobin eventually  stabilized at 9 with need for  infusion.  She was initially given a course of ibuprofen.  She was also  treated with betamethasone for fetal lung maturity. The patient was  intermittently monitored and fetal heart rate tracing was consistent with  her gestational age.   At the time of discharge on the 30th.  The patient had not had any new bleed  recently with the last bright red blood occurring on November 13, 2004.  The  patient's hemoglobin was stable at 9.  The patient had some symptoms of  orthostasis however, these were resolving.  The patient was being discharged  on iron and Colace with prenatal vitamins with instructions to use caution  when standing.  The patient is to have bed rest with bathroom privileges,  [facility] on [facility], 2004.   CONDITION ON DISCHARGE:  Stable.   DISCHARGE INSTRUCTIONS:  As listed above.      GSD/MEDQ  D:  11/22/2004  T:  11/22/2004  Job:  [id number]  cc:   Conni Elliot, M.D.  [address]  [place]  [place] [address]  Fax: [phone]

## 2017-05-30 LAB — OB RESULTS CONSOLE ANTIBODY SCREEN: Antibody Screen: NEGATIVE

## 2017-05-30 LAB — OB RESULTS CONSOLE HIV ANTIBODY (ROUTINE TESTING): HIV: NONREACTIVE

## 2017-05-30 LAB — OB RESULTS CONSOLE GC/CHLAMYDIA
CHLAMYDIA, DNA PROBE: NEGATIVE
GC PROBE AMP, GENITAL: NEGATIVE

## 2017-05-30 LAB — OB RESULTS CONSOLE ABO/RH: RH Type: POSITIVE

## 2017-05-30 LAB — OB RESULTS CONSOLE RPR: RPR: NONREACTIVE

## 2017-05-30 LAB — OB RESULTS CONSOLE HEPATITIS B SURFACE ANTIGEN: Hepatitis B Surface Ag: NEGATIVE

## 2017-05-30 LAB — OB RESULTS CONSOLE RUBELLA ANTIBODY, IGM: Rubella: IMMUNE

## 2017-08-26 NOTE — L&D Delivery Note (Signed)
SVD of vigorous female infant w/ apgars of 9,9.  Placenta delivered spontaneous w/ 3VC.  Mild PP hemorrhage responded quickly to bimanual massage and methergine  2nd degree lac repaired w/ 3-0 vicryl rapide.  Fundus firm.  EBL 350cc .

## 2017-10-05 NOTE — Progress Notes (Signed)
Cardiology Office Note   Date:  10/06/2017   ID:  Stacie Moreno, DOB November 18, 1985, MRN [id number]  PCP:  Mitchel Honour, DO  Cardiologist:   No primary care provider on file. Referring:  Zelphia Cairo, MD  Chief Complaint  Patient presents with  . Shortness of Breath      History of Present Illness: Stacie Moreno is a 32 y.o. female who is referred by Zelphia Cairo, MD for evaluation of SOB and palpitations.  The patient is [redacted] weeks pregnant with her third child.  She had no problems with her first 2 pregnancies.  She does feel her heart skipping occasionally.  However, the biggest issue is that she has episodes sometimes several times a day but not every day having some sensation like she cannot get enough air.  She will feel her heart racing.  Her arms will be heavy.  She has to bend over or lie down.  There is no correlation with the skipping heartbeats.  She has no orthostatic symptoms.  The episodes last for a couple of minutes.  He cannot bring them on.  They are not positional.  She is not describing chest pressure, neck or arm discomfort.  She has not had any frank syncope.  If she is not having these episodes she feels well and denies any shortness of breath, PND or orthopnea.  She is never had anything like this before and has had no reason to have cardiac workup.    I did review some office records and she had normal labs to include a normal TSH.   PMH:  None  PSH:  None    Current Outpatient Medications  Medication Sig Dispense Refill  . Prenatal Vit-Fe Fumarate-FA (PRENATAL MULTIVITAMIN) TABS tablet Take 1 tablet by mouth daily at 12 noon.     No current facility-administered medications for this visit.     Allergies:   Amoxicillin; Penicillins; and Clindamycin/lincomycin    Social History:  The patient  reports that she has quit smoking. She uses smokeless tobacco.   Family History:  The patient's family history includes Cancer in her maternal  grandfather and maternal grandmother; Diabetes in her maternal grandfather; Heart attack in her paternal grandfather; Stroke in her paternal grandmother.    ROS:  Please see the history of present illness.   Otherwise, review of systems are positive for none.   All other systems are reviewed and negative.    PHYSICAL EXAM: VS:  BP 116/68   Pulse 78   Ht 5\' 1"  (1.549 m)   Wt 153 lb (69.4 kg)   BMI 28.91 kg/m  , BMI Body mass index is 28.91 kg/m. GENERAL:  Well appearing HEENT:  Pupils equal round and reactive, fundi not visualized, oral mucosa unremarkable NECK:  No jugular venous distention, waveform within normal limits, carotid upstroke brisk and symmetric, no bruits, no thyromegaly LYMPHATICS:  No cervical, inguinal adenopathy LUNGS:  Clear to auscultation bilaterally BACK:  No CVA tenderness CHEST:  Unremarkable HEART:  PMI not displaced or sustained,S1 and S2 within normal limits, no S3, no S4, no clicks, no rubs, no murmurs ABD:  Flat, positive bowel sounds normal in frequency in pitch, no bruits, no rebound, no guarding, no midline pulsatile mass, no hepatomegaly, no splenomegaly, gravid EXT:  2 plus pulses throughout, no edema, no cyanosis no clubbing SKIN:  No rashes no nodules NEURO:  Cranial nerves II through XII grossly intact, motor grossly intact throughout PSYCH:  Cognitively intact, oriented  to person place and time    EKG:  EKG is ordered today. The ekg ordered today demonstrates sinus rhythm, rate 78, axis within normal limits, intervals within normal limits, no acute ST-T wave changes.   Recent Labs: No results found for requested labs within last 8760 hours.    Lipid Panel No results found for: CHOL, TRIG, HDL, CHOLHDL, VLDL, LDLCALC, LDLDIRECT    Wt Readings from Last 3 Encounters:  10/06/17 153 lb (69.4 kg)      Other studies Reviewed: Additional studies/ records that were reviewed today include: Office records. Review of the above records  demonstrates:  Please see elsewhere in the note.     ASSESSMENT AND PLAN:    Palpitations: The patient may be having some tachyarrhythmia to explain her episodes of shortness of breath. We discussed an AliveCor band for her Apple Watch. However, for now we are going to start with a 21 day event.  Stacie Moreno will need a 21 day event monitor.  The patients symptoms necessitate an event monitor.  The symptoms are too infrequent to be identified on a Holter monitor.    Of note she was not orthostatic in the office.  SOB:  I will also order an echocardiogram.     Current medicines are reviewed at length with the patient today.  The patient does not have concerns regarding medicines.  The following changes have been made:  no change  Labs/ tests ordered today include:   Orders Placed This Encounter  Procedures  . CARDIAC EVENT MONITOR  . EKG 12-Lead  . ECHOCARDIOGRAM COMPLETE     Disposition:   FU with me after the above studies.     Signed, Rollene Rotunda, MD  10/06/2017 2:38 PM    [facility]

## 2017-10-06 ENCOUNTER — Ambulatory Visit (INDEPENDENT_AMBULATORY_CARE_PROVIDER_SITE_OTHER): Payer: Medicaid Other | Admitting: Cardiology

## 2017-10-06 ENCOUNTER — Encounter: Payer: Self-pay | Admitting: Cardiology

## 2017-10-06 VITALS — BP 116/68 | HR 78 | Ht 61.0 in | Wt 153.0 lb

## 2017-10-06 DIAGNOSIS — R002 Palpitations: Secondary | ICD-10-CM

## 2017-10-06 DIAGNOSIS — R0602 Shortness of breath: Secondary | ICD-10-CM

## 2017-10-06 NOTE — Patient Instructions (Signed)
Medication Instructions:  Continue current medications  If you need a refill on your cardiac medications before your next appointment, please call your pharmacy.  Labwork: None Ordered   Testing/Procedures: Your physician has requested that you have an echocardiogram. Echocardiography is a painless test that uses sound waves to create images of your heart. It provides your doctor with information about the size and shape of your heart and how well your heart's chambers and valves are working. This procedure takes approximately one hour. There are no restrictions for this procedure.  Your physician has recommended that you wear an event monitor for 21 days. Event monitors are medical devices that record the heart's electrical activity. Doctors most often us these monitors to diagnose arrhythmias. Arrhythmias are problems with the speed or rhythm of the heartbeat. The monitor is a small, portable device. You can wear one while you do your normal daily activities. This is usually used to diagnose what is causing palpitations/syncope (passing out).  Special Instructions:  ALIVECOR  Follow-Up: Your physician wants you to follow-up in: After Test.     [facility] you [facility] choosing [facility]!!

## 2017-10-14 ENCOUNTER — Ambulatory Visit (HOSPITAL_COMMUNITY): Payer: Medicaid Other | Attending: Cardiology

## 2017-10-14 ENCOUNTER — Ambulatory Visit (INDEPENDENT_AMBULATORY_CARE_PROVIDER_SITE_OTHER): Payer: Medicaid Other

## 2017-10-14 ENCOUNTER — Other Ambulatory Visit: Payer: Self-pay

## 2017-10-14 DIAGNOSIS — R0602 Shortness of breath: Secondary | ICD-10-CM

## 2017-10-14 DIAGNOSIS — R002 Palpitations: Secondary | ICD-10-CM

## 2017-10-14 DIAGNOSIS — R0600 Dyspnea, unspecified: Secondary | ICD-10-CM | POA: Insufficient documentation

## 2017-10-21 ENCOUNTER — Telehealth: Payer: Self-pay | Admitting: Cardiology

## 2017-10-21 NOTE — Telephone Encounter (Signed)
New message    Patient returning call for Echo results

## 2017-10-21 NOTE — Telephone Encounter (Signed)
Pt aware of Echo

## 2017-11-03 ENCOUNTER — Telehealth: Payer: Self-pay | Admitting: Cardiology

## 2017-11-03 NOTE — Telephone Encounter (Signed)
Closed Encounter  °

## 2017-11-10 ENCOUNTER — Ambulatory Visit: Payer: Medicaid Other | Admitting: Cardiology

## 2017-11-12 ENCOUNTER — Encounter: Payer: Self-pay | Admitting: Cardiology

## 2017-11-19 NOTE — Progress Notes (Deleted)
Cardiology Office Note   Date:  11/19/2017   ID:  Stacie Haleshley A Moylan, DOB 04/02/1986, MRN [id number]  PCP:  Mitchel HonourMorris, Megan, DO  Cardiologist:   No primary care provider on file. Referring:  Mitchel HonourMorris, Megan, DO  No chief complaint on file.     History of Present Illness: Stacie Moreno is a 32 y.o. female who is referred by Mitchel HonourMorris, Megan, DO for evaluation of SOB and palpitations.  The patient is *** weeks pregnant with her third child.  After the first visit she had an echo which ws NL.  She had an event monitor that did not demonstrate an arrhythmia.   ***    She had no problems with her first 2 pregnancies.  She does feel her heart skipping occasionally.  However, the biggest issue is that she has episodes sometimes several times a day but not every day having some sensation like she cannot get enough air.  She will feel her heart racing.  Her arms will be heavy.  She has to bend over or lie down.  There is no correlation with the skipping heartbeats.  She has no orthostatic symptoms.  The episodes last for a couple of minutes.  He cannot bring them on.  They are not positional.  She is not describing chest pressure, neck or arm discomfort.  She has not had any frank syncope.  If she is not having these episodes she feels well and denies any shortness of breath, PND or orthopnea.  She is never had anything like this before and has had no reason to have cardiac workup.    I did review some office records and she had normal labs to include a normal TSH.   PMH:  None  PSH:  None    Current Outpatient Medications  Medication Sig Dispense Refill  . Prenatal Vit-Fe Fumarate-FA (PRENATAL MULTIVITAMIN) TABS tablet Take 1 tablet by mouth daily at 12 noon.     No current facility-administered medications for this visit.     Allergies:   Amoxicillin; Penicillins; and Clindamycin/lincomycin   ROS:  Please see the history of present illness.   Otherwise, review of systems are positive for  none.   All other systems are reviewed and negative.    PHYSICAL EXAM: VS:  There were no vitals taken for this visit. , BMI There is no height or weight on file to calculate BMI.  GENERAL:  Well appearing NECK:  No jugular venous distention, waveform within normal limits, carotid upstroke brisk and symmetric, no bruits, no thyromegaly LUNGS:  Clear to auscultation bilaterally CHEST:  Unremarkable HEART:  PMI not displaced or sustained,S1 and S2 within normal limits, no S3, no S4, no clicks, no rubs, *** murmurs ABD:  Flat, positive bowel sounds normal in frequency in pitch, no bruits, no rebound, no guarding, no midline pulsatile mass, no hepatomegaly, no splenomegaly, gravid  EXT:  2 plus pulses throughout, no edema, no cyanosis no clubbing   GENERAL:  Well appearing HEENT:  Pupils equal round and reactive, fundi not visualized, oral mucosa unremarkable NECK:  No jugular venous distention, waveform within normal limits, carotid upstroke brisk and symmetric, no bruits, no thyromegaly LYMPHATICS:  No cervical, inguinal adenopathy LUNGS:  Clear to auscultation bilaterally BACK:  No CVA tenderness CHEST:  Unremarkable HEART:  PMI not displaced or sustained,S1 and S2 within normal limits, no S3, no S4, no clicks, no rubs, no murmurs ABD:  Flat, positive bowel sounds normal in frequency in pitch,  no bruits, no rebound, no guarding, no midline pulsatile mass, no hepatomegaly, no splenomegaly, gravid EXT:  2 plus pulses throughout, no edema, no cyanosis no clubbing SKIN:  No rashes no nodules NEURO:  Cranial nerves II through XII grossly intact, motor grossly intact throughout PSYCH:  Cognitively intact, oriented to person place and time    EKG:  EKG is *** ordered today. ***   Recent Labs: No results found for requested labs within last 8760 hours.    Lipid Panel No results found for: CHOL, TRIG, HDL, CHOLHDL, VLDL, LDLCALC, LDLDIRECT    Wt Readings from Last 3 Encounters:    10/06/17 153 lb (69.4 kg)      Other studies Reviewed: Additional studies/ records that were reviewed today include: *** Review of the above records demonstrates:  ***   ASSESSMENT AND PLAN:    Palpitations:   ***  The patient may be having some tachyarrhythmia to explain her episodes of shortness of breath. We discussed an AliveCor band for her Apple Watch. However, for now we are going to start with a 21 day event.  Stacie Moreno will need a 21 day event monitor.  The patients symptoms necessitate an event monitor.  The symptoms are too infrequent to be identified on a Holter monitor.    Of note she was not orthostatic in the office.  SOB:   Echo was normal.  ***   I will also order an echocardiogram.     Current medicines are reviewed at length with the patient today.  The patient does not have concerns regarding medicines.  The following changes have been made: ***  Labs/ tests ordered today include:   ***  No orders of the defined types were placed in this encounter.    Disposition:   FU with me ***    Signed, Rollene Rotunda, MD  11/19/2017 8:31 PM    [facility]

## 2017-11-20 ENCOUNTER — Ambulatory Visit: Payer: Medicaid Other | Admitting: Cardiology

## 2017-11-21 ENCOUNTER — Encounter: Payer: Self-pay | Admitting: *Deleted

## 2017-12-02 LAB — OB RESULTS CONSOLE GBS: STREP GROUP B AG: NEGATIVE

## 2017-12-15 ENCOUNTER — Encounter (HOSPITAL_COMMUNITY): Payer: Self-pay | Admitting: *Deleted

## 2017-12-15 ENCOUNTER — Telehealth (HOSPITAL_COMMUNITY): Payer: Self-pay | Admitting: *Deleted

## 2017-12-15 NOTE — Telephone Encounter (Signed)
Preadmission screen  

## 2017-12-20 ENCOUNTER — Other Ambulatory Visit: Payer: Self-pay

## 2017-12-20 ENCOUNTER — Inpatient Hospital Stay (HOSPITAL_COMMUNITY): Payer: Managed Care, Other (non HMO) | Admitting: Anesthesiology

## 2017-12-20 ENCOUNTER — Inpatient Hospital Stay (HOSPITAL_COMMUNITY)
Admission: AD | Admit: 2017-12-20 | Discharge: 2017-12-21 | DRG: 806 | Disposition: A | Discharge status: Home / Self Care | Payer: Managed Care, Other (non HMO) | Source: Ambulatory Visit | Attending: Obstetrics and Gynecology | Admitting: Obstetrics and Gynecology

## 2017-12-20 ENCOUNTER — Encounter (HOSPITAL_COMMUNITY): Payer: Self-pay

## 2017-12-20 DIAGNOSIS — Z3490 Encounter for supervision of normal pregnancy, unspecified, unspecified trimester: Secondary | ICD-10-CM

## 2017-12-20 DIAGNOSIS — Z87891 Personal history of nicotine dependence: Secondary | ICD-10-CM

## 2017-12-20 DIAGNOSIS — Z3A38 38 weeks gestation of pregnancy: Secondary | ICD-10-CM

## 2017-12-20 DIAGNOSIS — Z3483 Encounter for supervision of other normal pregnancy, third trimester: Secondary | ICD-10-CM | POA: Diagnosis present

## 2017-12-20 LAB — CBC
HEMATOCRIT: 36.7 % (ref 36.0–46.0)
HEMOGLOBIN: 12.5 g/dL (ref 12.0–15.0)
MCH: 29.3 pg (ref 26.0–34.0)
MCHC: 34.1 g/dL (ref 30.0–36.0)
MCV: 85.9 fL (ref 78.0–100.0)
Platelets: 144 10*3/uL — ABNORMAL LOW (ref 150–400)
RBC: 4.27 MIL/uL (ref 3.87–5.11)
RDW: 16.2 % — ABNORMAL HIGH (ref 11.5–15.5)
WBC: 14.4 10*3/uL — ABNORMAL HIGH (ref 4.0–10.5)

## 2017-12-20 LAB — TYPE AND SCREEN
ABO/RH(D): A POS
Antibody Screen: NEGATIVE

## 2017-12-20 LAB — RPR: RPR Ser Ql: NONREACTIVE

## 2017-12-20 LAB — ABO/RH: ABO/RH(D): A POS

## 2017-12-20 MED ORDER — IBUPROFEN 600 MG PO TABS
600.0000 mg | ORAL_TABLET | Freq: Four times a day (QID) | ORAL | Status: DC
  Administered 2017-12-20 – 2017-12-21 (×3): 600 mg via ORAL
  Filled 2017-12-20 (×3): qty 1

## 2017-12-20 MED ORDER — OXYCODONE-ACETAMINOPHEN 5-325 MG PO TABS: 1.0000 | ORAL_TABLET | ORAL | Status: DC | PRN

## 2017-12-20 MED ORDER — ONDANSETRON HCL 4 MG/2ML IJ SOLN: 4.0000 mg | INTRAMUSCULAR | Status: DC | PRN

## 2017-12-20 MED ORDER — DIPHENHYDRAMINE HCL 50 MG/ML IJ SOLN: 12.5000 mg | INTRAMUSCULAR | Status: DC | PRN

## 2017-12-20 MED ORDER — TETANUS-DIPHTH-ACELL PERTUSSIS 5-2.5-18.5 LF-MCG/0.5 IM SUSP: 0.5000 mL | Freq: Once | INTRAMUSCULAR | Status: DC

## 2017-12-20 MED ORDER — EPHEDRINE 5 MG/ML INJ
10.0000 mg | INTRAVENOUS | Status: DC | PRN
  Filled 2017-12-20: qty 2

## 2017-12-20 MED ORDER — ONDANSETRON HCL 4 MG/2ML IJ SOLN
4.0000 mg | Freq: Four times a day (QID) | INTRAMUSCULAR | Status: DC | PRN
  Administered 2017-12-20: 4 mg via INTRAVENOUS
  Filled 2017-12-20: qty 2

## 2017-12-20 MED ORDER — PRENATAL MULTIVITAMIN CH
1.0000 | ORAL_TABLET | Freq: Every day | ORAL | Status: DC
  Administered 2017-12-21: 1 via ORAL
  Filled 2017-12-20: qty 1

## 2017-12-20 MED ORDER — METHYLERGONOVINE MALEATE 0.2 MG/ML IJ SOLN
0.2000 mg | Freq: Once | INTRAMUSCULAR | Status: AC
  Administered 2017-12-20: 0.2 mg via INTRAMUSCULAR

## 2017-12-20 MED ORDER — MEASLES, MUMPS & RUBELLA VAC ~~LOC~~ INJ: 0.5000 mL | INJECTION | Freq: Once | SUBCUTANEOUS | Status: DC

## 2017-12-20 MED ORDER — SIMETHICONE 80 MG PO CHEW: 80.0000 mg | CHEWABLE_TABLET | ORAL | Status: DC | PRN

## 2017-12-20 MED ORDER — ACETAMINOPHEN 325 MG PO TABS
650.0000 mg | ORAL_TABLET | ORAL | Status: DC | PRN
  Administered 2017-12-20: 650 mg via ORAL
  Filled 2017-12-20: qty 2

## 2017-12-20 MED ORDER — METHYLERGONOVINE MALEATE 0.2 MG/ML IJ SOLN
INTRAMUSCULAR | Status: AC
Start: 2017-12-20 — End: 2017-12-21
  Filled 2017-12-20: qty 1

## 2017-12-20 MED ORDER — DIPHENHYDRAMINE HCL 25 MG PO CAPS: 25.0000 mg | ORAL_CAPSULE | Freq: Four times a day (QID) | ORAL | Status: DC | PRN

## 2017-12-20 MED ORDER — DIBUCAINE 1 % RE OINT
1.0000 "application " | TOPICAL_OINTMENT | RECTAL | Status: DC | PRN
  Administered 2017-12-21: 1 via RECTAL
  Filled 2017-12-20: qty 28

## 2017-12-20 MED ORDER — SOD CITRATE-CITRIC ACID 500-334 MG/5ML PO SOLN: 30.0000 mL | ORAL | Status: DC | PRN

## 2017-12-20 MED ORDER — OXYTOCIN BOLUS FROM INFUSION
500.0000 mL | Freq: Once | INTRAVENOUS | Status: AC
  Administered 2017-12-20: 500 mL via INTRAVENOUS

## 2017-12-20 MED ORDER — LIDOCAINE HCL (PF) 1 % IJ SOLN
30.0000 mL | INTRAMUSCULAR | Status: DC | PRN
  Filled 2017-12-20: qty 30

## 2017-12-20 MED ORDER — OXYCODONE-ACETAMINOPHEN 5-325 MG PO TABS: 2.0000 | ORAL_TABLET | ORAL | Status: DC | PRN

## 2017-12-20 MED ORDER — OXYCODONE-ACETAMINOPHEN 5-325 MG PO TABS
2.0000 | ORAL_TABLET | ORAL | Status: DC | PRN
  Administered 2017-12-21: 2 via ORAL
  Filled 2017-12-20: qty 2

## 2017-12-20 MED ORDER — OXYTOCIN 40 UNITS IN LACTATED RINGERS INFUSION - SIMPLE MED
2.5000 [IU]/h | INTRAVENOUS | Status: DC
  Filled 2017-12-20: qty 1000

## 2017-12-20 MED ORDER — WITCH HAZEL-GLYCERIN EX PADS
1.0000 "application " | MEDICATED_PAD | CUTANEOUS | Status: DC | PRN
  Administered 2017-12-21: 1 via TOPICAL

## 2017-12-20 MED ORDER — FENTANYL 2.5 MCG/ML BUPIVACAINE 1/10 % EPIDURAL INFUSION (WH - ANES)
14.0000 mL/h | INTRAMUSCULAR | Status: DC | PRN
  Administered 2017-12-20: 14 mL/h via EPIDURAL

## 2017-12-20 MED ORDER — LACTATED RINGERS IV SOLN: 500.0000 mL | Freq: Once | INTRAVENOUS | Status: DC

## 2017-12-20 MED ORDER — PHENYLEPHRINE 40 MCG/ML (10ML) SYRINGE FOR IV PUSH (FOR BLOOD PRESSURE SUPPORT)
80.0000 ug | PREFILLED_SYRINGE | INTRAVENOUS | Status: DC | PRN
  Filled 2017-12-20: qty 5

## 2017-12-20 MED ORDER — OXYCODONE-ACETAMINOPHEN 5-325 MG PO TABS
1.0000 | ORAL_TABLET | ORAL | Status: DC | PRN
  Administered 2017-12-21 (×2): 1 via ORAL
  Filled 2017-12-20 (×2): qty 1

## 2017-12-20 MED ORDER — FENTANYL 2.5 MCG/ML BUPIVACAINE 1/10 % EPIDURAL INFUSION (WH - ANES)
INTRAMUSCULAR | Status: AC
  Filled 2017-12-20: qty 100

## 2017-12-20 MED ORDER — SENNOSIDES-DOCUSATE SODIUM 8.6-50 MG PO TABS
2.0000 | ORAL_TABLET | ORAL | Status: DC
  Administered 2017-12-21: 2 via ORAL
  Filled 2017-12-20: qty 2

## 2017-12-20 MED ORDER — LIDOCAINE HCL (PF) 1 % IJ SOLN
INTRAMUSCULAR | Status: DC | PRN
  Administered 2017-12-20: 13 mL via EPIDURAL

## 2017-12-20 MED ORDER — ACETAMINOPHEN 325 MG PO TABS
650.0000 mg | ORAL_TABLET | ORAL | Status: DC | PRN
Start: 2017-12-20 — End: 2017-12-21
  Filled 2017-12-20: qty 2

## 2017-12-20 MED ORDER — MEDROXYPROGESTERONE ACETATE 150 MG/ML IM SUSP: 150.0000 mg | INTRAMUSCULAR | Status: DC | PRN

## 2017-12-20 MED ORDER — ONDANSETRON HCL 4 MG PO TABS: 4.0000 mg | ORAL_TABLET | ORAL | Status: DC | PRN

## 2017-12-20 MED ORDER — PHENYLEPHRINE 40 MCG/ML (10ML) SYRINGE FOR IV PUSH (FOR BLOOD PRESSURE SUPPORT)
PREFILLED_SYRINGE | INTRAVENOUS | Status: AC
  Filled 2017-12-20: qty 20

## 2017-12-20 MED ORDER — LACTATED RINGERS IV SOLN: 500.0000 mL | INTRAVENOUS | Status: DC | PRN

## 2017-12-20 MED ORDER — COCONUT OIL OIL: 1.0000 "application " | TOPICAL_OIL | Status: DC | PRN

## 2017-12-20 MED ORDER — LACTATED RINGERS IV SOLN
INTRAVENOUS | Status: DC
  Administered 2017-12-20: 09:00:00 via INTRAVENOUS

## 2017-12-20 MED ORDER — BENZOCAINE-MENTHOL 20-0.5 % EX AERO
1.0000 "application " | INHALATION_SPRAY | CUTANEOUS | Status: DC | PRN
  Administered 2017-12-20: 1 via TOPICAL
  Filled 2017-12-20: qty 56

## 2017-12-20 NOTE — Progress Notes (Signed)
Pt comfortable  FHT cat 1 Toco Q1-2 Cvx 6/C/-1  A/P:  Exp mngt

## 2017-12-20 NOTE — Progress Notes (Signed)
Pt comfortable w/ epidural  FHT cat 1 Toco Q1-2 Cvx 5/90/-1 AROM - clear w/ bloody show  A/P:  Exp mngt

## 2017-12-20 NOTE — Anesthesia Preprocedure Evaluation (Signed)
Anesthesia Evaluation  Patient identified by MRN, date of birth, ID band Patient awake    Reviewed: Allergy & Precautions, NPO status , Patient's Chart, lab work & pertinent test results  Airway Mallampati: II  TM Distance: >3 FB Neck ROM: Full    Dental no notable dental hx.    Pulmonary neg pulmonary ROS, former smoker,    Pulmonary exam normal breath sounds clear to auscultation       Cardiovascular negative cardio ROS Normal cardiovascular exam Rhythm:Regular Rate:Normal     Neuro/Psych negative neurological ROS  negative psych ROS   GI/Hepatic negative GI ROS, Neg liver ROS,   Endo/Other  negative endocrine ROS  Renal/GU negative Renal ROS  negative genitourinary   Musculoskeletal negative musculoskeletal ROS (+)   Abdominal   Peds negative pediatric ROS (+)  Hematology negative hematology ROS (+)   Anesthesia Other Findings   Reproductive/Obstetrics negative OB ROS (+) Pregnancy                             Anesthesia Physical Anesthesia Plan  ASA: II  Anesthesia Plan: Epidural   Post-op Pain Management:    Induction:   PONV Risk Score and Plan:   Airway Management Planned:   Additional Equipment:   Intra-op Plan:   Post-operative Plan:   Informed Consent:   Plan Discussed with:   Anesthesia Plan Comments:         Anesthesia Quick Evaluation  

## 2017-12-20 NOTE — H&P (Signed)
Stacie Moreno is a 31 y.o. female presenting for SOL.   OB History    Gravida  3   Para  2   Term  2   Preterm      AB      Living  2     SAB      TAB      Ectopic      Multiple      Live Births  2          History reviewed. No pertinent past medical history. History reviewed. No pertinent surgical history. Family History: family history includes Breast cancer in her maternal aunt and maternal grandmother; Cancer in her maternal grandfather and maternal grandmother; Depression in her father and maternal grandfather; Diabetes in her maternal aunt and maternal grandfather; Heart attack in her maternal grandfather and paternal grandfather; Hypertension in her maternal grandmother; Stroke in her paternal grandfather. Social History:  reports that she has quit smoking. She uses smokeless tobacco. Her alcohol and drug histories are not on file.     Maternal Diabetes: No Genetic Screening: Declined Maternal Ultrasounds/Referrals: Normal Fetal Ultrasounds or other Referrals:  None Maternal Substance Abuse:  No Significant Maternal Medications:  None Significant Maternal Lab Results:  None Other Comments:  None  ROS History Dilation: 3 Effacement (%): 80 Station: -3 Exam by:: n druebbisch rn Blood pressure 134/84, pulse 79, temperature 97.7 F (36.5 C), temperature source Oral, resp. rate 19, height  (1.549 m), weight 170 lb 6.7 oz (77.3 kg). Exam Physical Exam  Prenatal labs: ABO, Rh: A/Positive/-- (10/05 0000) Antibody: Negative (10/05 0000) Rubella: Immune (10/05 0000) RPR: Nonreactive (10/05 0000)  HBsAg: Negative (10/05 0000)  HIV: Non-reactive (10/05 0000)  GBS: Negative (04/09 0000)   Assessment/Plan: Admit Epidural prn Exp mngt   Zelphia Cairo 12/20/2017, 8:22 AM

## 2017-12-20 NOTE — Anesthesia Procedure Notes (Signed)
Epidural Patient location during procedure: OB Start time: 12/20/2017 9:03 AM End time: 12/20/2017 9:17 AM  Staffing Anesthesiologist: Lowella Curb, MD Performed: anesthesiologist   Preanesthetic Checklist Completed: patient identified, site marked, surgical consent, pre-op evaluation, timeout performed, IV checked, risks and benefits discussed and monitors and equipment checked  Epidural Patient position: sitting Prep: ChloraPrep Patient monitoring: heart rate, cardiac monitor, continuous pulse ox and blood pressure Approach: midline Location: L2-L3 Injection technique: LOR saline  Needle:  Needle type: Tuohy  Needle gauge: 17 G Needle length: 9 cm Needle insertion depth: 4 cm Catheter type: closed end flexible Catheter size: 20 Guage Catheter at skin depth: 8 cm Test dose: negative  Assessment Events: blood not aspirated, injection not painful, no injection resistance, negative IV test and no paresthesia  Additional Notes Reason for block:procedure for pain

## 2017-12-20 NOTE — Progress Notes (Signed)
Pt comfortable  FHT cat 1 Toco Q1-2 Cvx 9.5/C/+1  A/P:  Exp mngt

## 2017-12-20 NOTE — MAU Note (Addendum)
Pt reports contractions started at 11:30p and have been every 2.2mins x4-5 hours. Pt denies LOF. Reports bloody show. Reports good fetal movement.

## 2017-12-20 NOTE — Lactation Note (Signed)
This note was copied from a baby's chart. Lactation Consultation Note  Patient Name: Stacie Moreno [name] Date: 12/20/2017 Reason for consult: Initial assessment;Early term 23-38.6wks  7 hours old early term female who is being exclusively BF by her mother, she's a P3 and somehow experienced BF, she was able to BF her first child for 1 1/2 months and her second one for 3 months. She faced some breastfeeding difficulties though, she had to use NS for both of her children because both babies had difficulties latching on. No Hx of tongue or lip tie.  Mom was trying to nurse baby when entering the room, offered assistance with latch, mom had baby swaddled. Place baby on mom's breast STS and tried to latch him onto right breast first on cross cradle hold; baby latched on very briefly but kept coming off, he was cueing, opening his mouth really big and wide but as soon as mother or LC bring him to the breast he'd push away from the breast and wouldn't latch (or break the latch, if he latched at all for a few seconds) mom believes it's because baby is in pain/discomfort due to bruising on his head; LC also noted large bruising on baby's tongue. Did some suck training with a gloved finger and baby was able to suck but it was a weak suck. Placed him again back to the breast, this time to the left one on cross cradle as well but still, no latch achieved.   Asked mom how does she feel about trying a NS and she voiced she doesn't want to use it again, she had a hard time already with both of her children but that she'd be willing to pump while in the hospital, she also has a Medela DEBP at home. Set mom up with a DEBP, reviewed pump instructions, cleaning and storage. Mom will pump every 3 hours, right after feedings and at least once at night.  Encouraged mom to keep putting baby to the breast STS 8-12 times/24 hours or sooner if feeding cues are present. She'll call for latch assistance when needed. She'll  also start pumping tonight and every 3 hours starting tomorrow morning. Reviewed BF brochure, BF resources and feeding diary, mom is aware of LC services and will call PRN.    Maternal Data Formula Feeding for Exclusion: No Has patient been taught Hand Expression?: Yes Does the patient have breastfeeding experience prior to this delivery?: Yes  Feeding Feeding Type: Breast Fed Length of feed: 5 min  LATCH Score Latch: Too sleepy or reluctant, no latch achieved, no sucking elicited.  Audible Swallowing: None  Type of Nipple: Everted at rest and after stimulation  Comfort (Breast/Nipple): Soft / non-tender  Hold (Positioning): Assistance needed to correctly position infant at breast and maintain latch.  LATCH Score: 5  Interventions Interventions: Breast feeding basics reviewed;Assisted with latch;Skin to skin;Breast massage;Breast compression;Adjust position;Support pillows;Position options;DEBP  Lactation Tools Discussed/Used Tools: Pump Breast pump type: Double-Electric Breast Pump WIC Program: No Pump Review: Setup, frequency, and cleaning;Milk Storage Initiated by:: MPeck Date initiated:: 12/20/17   Consult Status Consult Status: Follow-up Date: 12/21/17 Follow-up type: In-patient    Stacie Moreno Stacie Moreno 12/20/2017, 10:53 PM

## 2017-12-21 LAB — CBC
HCT: 35 % — ABNORMAL LOW (ref 36.0–46.0)
Hemoglobin: 11.8 g/dL — ABNORMAL LOW (ref 12.0–15.0)
MCH: 28.9 pg (ref 26.0–34.0)
MCHC: 33.7 g/dL (ref 30.0–36.0)
MCV: 85.8 fL (ref 78.0–100.0)
PLATELETS: 133 10*3/uL — AB (ref 150–400)
RBC: 4.08 MIL/uL (ref 3.87–5.11)
RDW: 16.8 % — ABNORMAL HIGH (ref 11.5–15.5)
WBC: 26 10*3/uL — AB (ref 4.0–10.5)

## 2017-12-21 MED ORDER — OXYCODONE-ACETAMINOPHEN 5-325 MG PO TABS: 1.0000 | ORAL_TABLET | ORAL | 0 refills | Status: AC | PRN

## 2017-12-21 MED ORDER — IBUPROFEN 600 MG PO TABS: 600.0000 mg | ORAL_TABLET | Freq: Four times a day (QID) | ORAL | 0 refills | Status: AC

## 2017-12-21 NOTE — Anesthesia Postprocedure Evaluation (Signed)
Anesthesia Post Note  Patient: Shrika Milos Schofield  Procedure(s) Performed: AN AD HOC LABOR EPIDURAL     Patient location during evaluation: Mother Baby Anesthesia Type: Epidural Level of consciousness: awake and alert Pain management: pain level controlled Vital Signs Assessment: post-procedure vital signs reviewed and stable Respiratory status: spontaneous breathing, nonlabored ventilation and respiratory function stable Cardiovascular status: stable Postop Assessment: no headache, no backache, epidural receding, adequate PO intake, no apparent nausea or vomiting and patient able to bend at knees Anesthetic complications: no    Last Vitals:  Vitals:   12/20/17 2154 12/21/17 0453  BP: 117/73 110/80  Pulse: 83 68  Resp: 17 17  Temp: 37.2 C 37.3 C  SpO2: 98% 99%    Last Pain:  Vitals:   12/21/17 0454  TempSrc:   PainSc: 8    Pain Goal:                 Laban Emperor

## 2017-12-21 NOTE — Discharge Summary (Signed)
Obstetric Discharge Summary Reason for Admission: onset of labor Prenatal Procedures: none Intrapartum Procedures: spontaneous vaginal delivery Postpartum Procedures: none Complications-Operative and Postpartum: 2nd degree perineal laceration Hemoglobin  Date Value Ref Range Status  12/21/2017 11.8 (L) 12.0 - 15.0 g/dL Final   HCT  Date Value Ref Range Status  12/21/2017 35.0 (L) 36.0 - 46.0 % Final    Physical Exam:  General: alert and cooperative Lochia: appropriate Uterine Fundus: firm Incision: n/a DVT Evaluation: No evidence of DVT seen on physical exam.  Discharge Diagnoses: Term Pregnancy-delivered  Discharge Information: Date: 12/21/2017 Activity: pelvic rest Diet: routine Medications: PNV, Ibuprofen and Percocet Condition: stable Instructions: refer to practice specific booklet Discharge to: home Follow-up Information    [place], [facility] Schedule an appointment as soon as possible for a visit in 6 week(s).   Contact information: [address] [place] [address] [phone]           Newborn Data: Live born female  Birth Weight: 7 lb 11.6 oz (3504 g) APGAR: 9, 9  Newborn Delivery   Birth date/time:  12/20/2017 15:14:00 Delivery type:  Vaginal, Spontaneous     Home with mother.  Zelphia Cairo 12/21/2017, 7:09 AM

## 2017-12-24 ENCOUNTER — Inpatient Hospital Stay (HOSPITAL_COMMUNITY): Payer: Medicaid Other

## 2017-12-25 ENCOUNTER — Inpatient Hospital Stay (HOSPITAL_COMMUNITY): Admission: RE | Admit: 2017-12-25 | Payer: Medicaid Other | Source: Ambulatory Visit
